# Patient Record
Sex: Female | Born: 1975 | Race: White | Hispanic: No | Marital: Married | Smoking: Never smoker
Health system: Southern US, Community
[De-identification: ages and names within clinical notes are randomized; demographics above are authoritative.]

## PROBLEM LIST (undated history)

## (undated) DIAGNOSIS — E282 Polycystic ovarian syndrome: Secondary | ICD-10-CM

## (undated) DIAGNOSIS — E039 Hypothyroidism, unspecified: Secondary | ICD-10-CM

## (undated) DIAGNOSIS — K2100 Gastro-esophageal reflux disease with esophagitis, without bleeding: Secondary | ICD-10-CM

## (undated) DIAGNOSIS — K589 Irritable bowel syndrome without diarrhea: Secondary | ICD-10-CM

## (undated) DIAGNOSIS — J45909 Unspecified asthma, uncomplicated: Secondary | ICD-10-CM

## (undated) DIAGNOSIS — J309 Allergic rhinitis, unspecified: Secondary | ICD-10-CM

## (undated) HISTORY — DX: Allergic rhinitis, unspecified: J30.9

## (undated) HISTORY — DX: Unspecified asthma, uncomplicated: J45.909

## (undated) HISTORY — DX: Hypothyroidism, unspecified: E03.9

## (undated) HISTORY — DX: Polycystic ovarian syndrome: E28.2

## (undated) HISTORY — DX: Irritable bowel syndrome, unspecified: K58.9

## (undated) HISTORY — DX: Gastro-esophageal reflux disease with esophagitis, without bleeding: K21.00

## (undated) HISTORY — DX: Gastro-esophageal reflux disease with esophagitis: K21.0

---

## 1998-12-16 HISTORY — PX: DILATION AND CURETTAGE OF UTERUS: SHX78

## 2012-12-22 DIAGNOSIS — J3489 Other specified disorders of nose and nasal sinuses: Secondary | ICD-10-CM | POA: Insufficient documentation

## 2012-12-22 DIAGNOSIS — J342 Deviated nasal septum: Secondary | ICD-10-CM | POA: Insufficient documentation

## 2013-05-24 DIAGNOSIS — J329 Chronic sinusitis, unspecified: Secondary | ICD-10-CM | POA: Insufficient documentation

## 2013-08-16 HISTORY — PX: NASAL SINUS SURGERY: SHX719

## 2013-09-01 ENCOUNTER — Ambulatory Visit: Payer: Self-pay | Admitting: Family Medicine

## 2013-09-28 LAB — HM PAP SMEAR: HM Pap smear: NEGATIVE

## 2015-06-01 ENCOUNTER — Other Ambulatory Visit: Payer: Self-pay | Admitting: Family Medicine

## 2015-06-01 DIAGNOSIS — Z3041 Encounter for surveillance of contraceptive pills: Secondary | ICD-10-CM

## 2015-06-01 DIAGNOSIS — Z309 Encounter for contraceptive management, unspecified: Secondary | ICD-10-CM | POA: Insufficient documentation

## 2015-07-07 ENCOUNTER — Other Ambulatory Visit: Payer: Self-pay | Admitting: Family Medicine

## 2015-07-07 NOTE — Telephone Encounter (Signed)
Last OV 03/2014  Thanks,   -Vernona Rieger

## 2015-08-02 DIAGNOSIS — L709 Acne, unspecified: Secondary | ICD-10-CM | POA: Insufficient documentation

## 2015-08-02 DIAGNOSIS — Z332 Encounter for elective termination of pregnancy: Secondary | ICD-10-CM | POA: Insufficient documentation

## 2015-08-02 DIAGNOSIS — J45909 Unspecified asthma, uncomplicated: Secondary | ICD-10-CM | POA: Insufficient documentation

## 2015-08-02 DIAGNOSIS — K2100 Gastro-esophageal reflux disease with esophagitis, without bleeding: Secondary | ICD-10-CM | POA: Insufficient documentation

## 2015-08-02 DIAGNOSIS — J309 Allergic rhinitis, unspecified: Secondary | ICD-10-CM | POA: Insufficient documentation

## 2015-08-02 DIAGNOSIS — E039 Hypothyroidism, unspecified: Secondary | ICD-10-CM | POA: Insufficient documentation

## 2015-08-02 DIAGNOSIS — K589 Irritable bowel syndrome without diarrhea: Secondary | ICD-10-CM | POA: Insufficient documentation

## 2015-08-02 DIAGNOSIS — L659 Nonscarring hair loss, unspecified: Secondary | ICD-10-CM | POA: Insufficient documentation

## 2015-08-02 DIAGNOSIS — E282 Polycystic ovarian syndrome: Secondary | ICD-10-CM | POA: Insufficient documentation

## 2015-08-03 ENCOUNTER — Encounter: Payer: Self-pay | Admitting: Family Medicine

## 2015-08-03 ENCOUNTER — Ambulatory Visit (INDEPENDENT_AMBULATORY_CARE_PROVIDER_SITE_OTHER): Payer: BLUE CROSS/BLUE SHIELD | Admitting: Family Medicine

## 2015-08-03 VITALS — BP 108/88 | HR 96 | Temp 98.0°F | Resp 16 | Ht 61.0 in | Wt 138.8 lb

## 2015-08-03 DIAGNOSIS — J0100 Acute maxillary sinusitis, unspecified: Secondary | ICD-10-CM

## 2015-08-03 MED ORDER — DOXYCYCLINE HYCLATE 100 MG PO TABS: 100.0000 mg | ORAL_TABLET | Freq: Two times a day (BID) | ORAL | Status: DC

## 2015-08-03 MED ORDER — HYDROCODONE-HOMATROPINE 5-1.5 MG/5ML PO SYRP: ORAL_SOLUTION | ORAL | Status: DC

## 2015-08-03 NOTE — Patient Instructions (Signed)
Continue Mucinex D. May wait for 2-3 days to see if sinuses clear prior to starting antibiotics if you wish.

## 2015-08-03 NOTE — Progress Notes (Signed)
Subjective:     Patient ID: Carolyn Haynes, female   DOB: 1976/10/13, 39 y.o.   MRN: [id number]  HPI  Chief Complaint  Patient presents with  . Cough    Patient comes in office today with concerns of cough, sore throat and right ear pain since 8/13. Patient states that she was seen at urgent care on 8/13 and diagnosed with virus and was advised to take Sudafed. Patient states that symptoms have not improved. OTC medication patient has continued is: Mucinex, Ibuprofen and Singulair  Reports increased sinus pressure and purulent drainage. Reports hx of sinus surgery. States daughter has been sick as well..   Review of Systems  Constitutional: Negative for fever and chills.  Endocrine:       States she has had updated thyroid test through work. Will try and obtain result for our review       Objective:   Physical Exam  Constitutional: She appears well-developed and well-nourished. No distress.  Ears: T.M's intact without inflammation Sinuses: Mild tenderness right maxillary area. Throat: no tonsillar enlargement or exudate Neck: no cervical adenopathy Lungs: clear     Assessment:    1. Acute maxillary sinusitis, recurrence not specified - doxycycline (VIBRA-TABS) 100 MG tablet; Take 1 tablet (100 mg total) by mouth 2 (two) times daily.  Dispense: 20 tablet; Refill: 0 - HYDROcodone-homatropine (HYCODAN) 5-1.5 MG/5ML syrup; 5 ml 4-6 hours as needed for cough  Dispense: 240 mL; Refill: 0    Plan:   Continue Mucinex D.  May give herself another 2-3 days prior to starting antibiotics.

## 2015-08-07 ENCOUNTER — Other Ambulatory Visit: Payer: Self-pay | Admitting: Family Medicine

## 2015-08-07 DIAGNOSIS — Z3041 Encounter for surveillance of contraceptive pills: Secondary | ICD-10-CM

## 2015-08-08 ENCOUNTER — Other Ambulatory Visit: Payer: Self-pay | Admitting: Family Medicine

## 2015-08-08 DIAGNOSIS — E039 Hypothyroidism, unspecified: Secondary | ICD-10-CM

## 2015-09-10 ENCOUNTER — Other Ambulatory Visit: Payer: Self-pay | Admitting: Family Medicine

## 2015-09-10 DIAGNOSIS — E039 Hypothyroidism, unspecified: Secondary | ICD-10-CM

## 2015-09-10 DIAGNOSIS — J45909 Unspecified asthma, uncomplicated: Secondary | ICD-10-CM

## 2016-01-25 ENCOUNTER — Other Ambulatory Visit: Payer: Self-pay | Admitting: Family Medicine

## 2016-01-25 DIAGNOSIS — Z3041 Encounter for surveillance of contraceptive pills: Secondary | ICD-10-CM

## 2016-03-04 ENCOUNTER — Other Ambulatory Visit: Payer: Self-pay | Admitting: Family Medicine

## 2016-03-04 DIAGNOSIS — E039 Hypothyroidism, unspecified: Secondary | ICD-10-CM

## 2016-05-31 ENCOUNTER — Other Ambulatory Visit: Payer: Self-pay | Admitting: Family Medicine

## 2016-07-01 ENCOUNTER — Ambulatory Visit (INDEPENDENT_AMBULATORY_CARE_PROVIDER_SITE_OTHER): Payer: BLUE CROSS/BLUE SHIELD | Admitting: Family Medicine

## 2016-07-01 ENCOUNTER — Encounter: Payer: Self-pay | Admitting: Family Medicine

## 2016-07-01 VITALS — BP 110/60 | HR 64 | Temp 98.2°F | Resp 16 | Ht 61.0 in | Wt 142.0 lb

## 2016-07-01 DIAGNOSIS — E039 Hypothyroidism, unspecified: Secondary | ICD-10-CM

## 2016-07-01 NOTE — Patient Instructions (Signed)
We will call you with the lab results. 

## 2016-07-01 NOTE — Progress Notes (Signed)
Subjective:     Patient ID: Carolyn Haynes, female   DOB: 1976-10-26, 40 y.o.   MRN: [id number]  HPI  Chief Complaint  Patient presents with  . Hypothyroidism    pt reports feeling tired all day long  States she feels like she did before being put on thyroid medication. Reports compliance with medication. Labs were ordered in March but not completed.   Review of Systems     Objective:   Physical Exam  Constitutional: She appears well-developed and well-nourished. No distress.       Assessment:    1. Hypothyroidism, unspecified hypothyroidism type     Free T4 and TSH    Plan:    Further f/u pending lab work.

## 2016-07-08 ENCOUNTER — Other Ambulatory Visit: Payer: Self-pay | Admitting: Family Medicine

## 2016-07-08 DIAGNOSIS — Z3041 Encounter for surveillance of contraceptive pills: Secondary | ICD-10-CM

## 2016-07-08 MED ORDER — DROSPIRENONE-ETHINYL ESTRADIOL 3-0.02 MG PO TABS: ORAL_TABLET | ORAL | 5 refills | Status: DC

## 2016-07-09 ENCOUNTER — Other Ambulatory Visit: Payer: Self-pay | Admitting: Family Medicine

## 2016-07-11 ENCOUNTER — Other Ambulatory Visit: Payer: Self-pay | Admitting: Family Medicine

## 2016-07-12 LAB — T4, FREE: Free T4: 1.39 ng/dL (ref 0.82–1.77)

## 2016-07-12 LAB — TSH: TSH: 2.75 u[IU]/mL (ref 0.450–4.500)

## 2016-07-15 ENCOUNTER — Telehealth: Payer: Self-pay

## 2016-07-15 NOTE — Telephone Encounter (Signed)
-----   Message from Anola Gurney, [place] sent at 07/15/2016  7:38 AM EDT ----- Thyroid ok; continue current dose of medication

## 2016-07-15 NOTE — Telephone Encounter (Signed)
Patient has been advised. KW 

## 2016-07-17 ENCOUNTER — Other Ambulatory Visit: Payer: Self-pay | Admitting: Family Medicine

## 2016-09-07 ENCOUNTER — Other Ambulatory Visit: Payer: Self-pay | Admitting: Family Medicine

## 2016-12-04 ENCOUNTER — Other Ambulatory Visit: Payer: Self-pay | Admitting: Family Medicine

## 2016-12-04 DIAGNOSIS — Z3041 Encounter for surveillance of contraceptive pills: Secondary | ICD-10-CM

## 2016-12-04 MED ORDER — DROSPIRENONE-ETHINYL ESTRADIOL 3-0.02 MG PO TABS: ORAL_TABLET | ORAL | 3 refills | Status: DC

## 2017-01-03 ENCOUNTER — Other Ambulatory Visit: Payer: Self-pay | Admitting: Family Medicine

## 2017-01-03 DIAGNOSIS — Z3041 Encounter for surveillance of contraceptive pills: Secondary | ICD-10-CM

## 2017-01-03 MED ORDER — DROSPIRENONE-ETHINYL ESTRADIOL 3-0.02 MG PO TABS: ORAL_TABLET | ORAL | 3 refills | Status: DC

## 2017-02-27 ENCOUNTER — Ambulatory Visit (INDEPENDENT_AMBULATORY_CARE_PROVIDER_SITE_OTHER): Payer: BLUE CROSS/BLUE SHIELD | Admitting: Family Medicine

## 2017-02-27 ENCOUNTER — Encounter: Payer: Self-pay | Admitting: Family Medicine

## 2017-02-27 VITALS — BP 124/90 | HR 96 | Temp 98.2°F | Resp 16 | Wt 134.4 lb

## 2017-02-27 DIAGNOSIS — G500 Trigeminal neuralgia: Secondary | ICD-10-CM | POA: Diagnosis not present

## 2017-02-27 MED ORDER — CARBAMAZEPINE 200 MG PO TABS: 200.0000 mg | ORAL_TABLET | Freq: Two times a day (BID) | ORAL | 0 refills | Status: DC

## 2017-02-27 NOTE — Patient Instructions (Signed)
Trigeminal Neuralgia Trigeminal neuralgia is a nerve disorder that causes attacks of severe facial pain. The attacks last from a few seconds to several minutes. They can happen for days, weeks, or months and then go away for months or years. Trigeminal neuralgia is also called tic douloureux. What are the causes? This condition is caused by damage to a nerve in the face that is called the trigeminal nerve. An attack can be triggered by:  Talking.  Chewing.  Putting on makeup.  Washing your face.  Shaving your face.  Brushing your teeth.  Touching your face. What increases the risk? This condition is more likely to develop in:  Women.  People who are 50 years of age or older. What are the signs or symptoms? The main symptom of this condition is pain in the jaw, lips, eyes, nose, scalp, forehead, and face. The pain may be intense, stabbing, electric, or shock-like. How is this diagnosed? This condition is diagnosed with a physical exam. A CT scan or MRI may be done to rule out other conditions that can cause facial pain. How is this treated? This condition may be treated with:  Avoiding the things that trigger your attacks.  Pain medicine.  Surgery. This may be done in severe cases if other medical treatment does not provide relief. Follow these instructions at home:  Take over-the-counter and prescription medicines only as told by your health care provider.  If you wish to get pregnant, talk with your health care provider before you start trying to get pregnant.  Avoid the things that trigger your attacks. It may help to:  Chew on the unaffected side of your mouth.  Avoid touching your face.  Avoid blasts of hot or cold air. Contact a health care provider if:  Your pain medicine is not helping.  You develop new, unexplained symptoms, such as:  Double vision.  Facial weakness.  Changes in hearing or balance.  You become pregnant. Get help right away  if:  Your pain is unbearable, and your pain medicine does not help. This information is not intended to replace advice given to you by your health care provider. Make sure you discuss any questions you have with your health care provider. Document Released: 11/29/2000 Document Revised: 08/04/2016 Document Reviewed: 03/27/2015 Elsevier Interactive Patient Education  2017 Elsevier Inc.  

## 2017-02-27 NOTE — Progress Notes (Addendum)
Subjective:     Patient ID: Carolyn Haynes, female   DOB: 08/15/1976, 41 y.o.   MRN: [id number]  HPI  Chief Complaint  Patient presents with  . Facial Pain    Patient comes in office today with complaints of sinus pain and pressure on the right side of her face since 01/21/17. Patient states that she has been on 3 rounds of steroids and antibiotics and nothing has cleared symptoms. Patient reports that she is currently using Afrin and Ibuprofen for relief.   States she last completed a course of Levaquin and prednisone around 3/10. Reports transient improvement with prednisone. Reports the whole right side of her face/neck is painful: "shooting pain." Can be exacerbated by touch and chewing. Had a dental exam recently with no adverse findings. Denis sinus drainage at this time. Has had prior nasal/left sided sinus surgery @ [facility] after a fracture.   Review of Systems     Objective:   Physical Exam  Constitutional: She appears well-developed and well-nourished. No distress.  HENT:  Right Ear: Tympanic membrane normal.  Left Ear: Tympanic membrane normal.  Mouth/Throat: Oropharynx is clear and moist.  Musculoskeletal:  No facial droop with preserved symmetry.  Skin:  Sensitive to touch along her right sided neck, face stopping at midline.       Assessment:    1. 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  Dispense: 28 tablet; Refill: 0    Plan:    Further f/u pending neurology evaluation.

## 2017-03-03 ENCOUNTER — Other Ambulatory Visit: Payer: Self-pay | Admitting: Family Medicine

## 2017-03-03 ENCOUNTER — Telehealth: Payer: Self-pay | Admitting: Family Medicine

## 2017-03-03 DIAGNOSIS — G500 Trigeminal neuralgia: Secondary | ICD-10-CM

## 2017-03-03 MED ORDER — CARBAMAZEPINE 200 MG PO TABS: ORAL_TABLET | ORAL | 0 refills | Status: DC

## 2017-03-03 NOTE — Telephone Encounter (Signed)
Pt was prescribed medication for facial nerve pain.  She said it is helping but not totally getting rid of the pain.  She wants to know if it can be increased.  [facility]  Thank sTeri

## 2017-03-03 NOTE — Telephone Encounter (Signed)
Patient has been advised. KW 

## 2017-03-03 NOTE — Telephone Encounter (Signed)
Increase carbamazepine to two pills twice daily. I have sent in additional medication.

## 2017-03-03 NOTE — Telephone Encounter (Signed)
Patient seen in office 02/28/07, please review note and advise. KW

## 2017-03-07 ENCOUNTER — Telehealth: Payer: Self-pay

## 2017-03-07 ENCOUNTER — Other Ambulatory Visit: Payer: Self-pay | Admitting: Family Medicine

## 2017-03-07 DIAGNOSIS — G500 Trigeminal neuralgia: Secondary | ICD-10-CM

## 2017-03-07 MED ORDER — GABAPENTIN 300 MG PO CAPS: ORAL_CAPSULE | ORAL | 0 refills | Status: DC

## 2017-03-07 NOTE — Telephone Encounter (Signed)
Patient called stating she had been seen by Bob Last weeNadine Countsk.  Chart diagnosis is trigeminal neuralgia.  She was given Tegretol 200 mg titrating from 1 BID to 2 BID.  5 days age she started on the 2 BID.  Since that time she has noticed that the pain is improving however she is having numbness of the tongue and feelings of dizziness, foggy and had near syncopal episode.  While on 1 BID she did not have any improvement in the pain.  After discussing with provider, the patient has been instructed to decrease to 1 BID and he will look into an alternate treatment.  Patient verbalizes understanding of the instructions and has been told we will call her with any further instructions.    Patient call back number is [phone] ED

## 2017-03-07 NOTE — Telephone Encounter (Signed)
Patient was calling back office stating that nurse she spoke with earlier was going to discuss her issue with Tegretol with PCP. Patient wanted to know if Nadine CountsBob would be switching her medication to something else? Please review and advise. KW

## 2017-03-07 NOTE — Telephone Encounter (Signed)
Discussed stopping carbamazapine and trying gabapentin which was sent to her pharmacy. Neurology appointment is not until April. I have asked her to call and see if there are any cancellations.

## 2017-03-12 DIAGNOSIS — G500 Trigeminal neuralgia: Secondary | ICD-10-CM | POA: Insufficient documentation

## 2017-03-18 ENCOUNTER — Encounter: Payer: Self-pay | Admitting: Family Medicine

## 2017-03-24 ENCOUNTER — Other Ambulatory Visit: Payer: Self-pay | Admitting: Neurology

## 2017-03-24 DIAGNOSIS — G500 Trigeminal neuralgia: Secondary | ICD-10-CM

## 2017-04-02 ENCOUNTER — Ambulatory Visit
Admission: RE | Admit: 2017-04-02 | Discharge: 2017-04-02 | Disposition: A | Discharge status: Home / Self Care | Payer: BLUE CROSS/BLUE SHIELD | Source: Ambulatory Visit | Attending: Neurology | Admitting: Neurology

## 2017-04-02 DIAGNOSIS — G500 Trigeminal neuralgia: Secondary | ICD-10-CM | POA: Diagnosis present

## 2017-04-02 DIAGNOSIS — R600 Localized edema: Secondary | ICD-10-CM | POA: Insufficient documentation

## 2017-04-02 DIAGNOSIS — Z9889 Other specified postprocedural states: Secondary | ICD-10-CM | POA: Diagnosis not present

## 2017-04-02 MED ORDER — GADOBENATE DIMEGLUMINE 529 MG/ML IV SOLN
15.0000 mL | Freq: Once | INTRAVENOUS | Status: AC | PRN
  Administered 2017-04-02: 12 mL via INTRAVENOUS

## 2017-06-06 ENCOUNTER — Encounter: Payer: Self-pay | Admitting: Family Medicine

## 2017-06-06 ENCOUNTER — Ambulatory Visit (INDEPENDENT_AMBULATORY_CARE_PROVIDER_SITE_OTHER): Payer: BLUE CROSS/BLUE SHIELD | Admitting: Family Medicine

## 2017-06-06 ENCOUNTER — Other Ambulatory Visit: Payer: Self-pay | Admitting: Family Medicine

## 2017-06-06 VITALS — BP 100/64 | HR 86 | Temp 97.9°F | Resp 16 | Wt 143.2 lb

## 2017-06-06 DIAGNOSIS — J019 Acute sinusitis, unspecified: Secondary | ICD-10-CM

## 2017-06-06 MED ORDER — ALBUTEROL SULFATE HFA 108 (90 BASE) MCG/ACT IN AERS: 2.0000 | INHALATION_SPRAY | Freq: Four times a day (QID) | RESPIRATORY_TRACT | 2 refills | Status: DC | PRN

## 2017-06-06 MED ORDER — DOXYCYCLINE HYCLATE 100 MG PO TABS: 100.0000 mg | ORAL_TABLET | Freq: Two times a day (BID) | ORAL | 0 refills | Status: DC

## 2017-06-06 NOTE — Progress Notes (Signed)
Subjective:     Patient ID: Carolyn Haynes, female   DOB: Aug 11, 1976, 41 y.o.   MRN: [id number]  HPI  Chief Complaint  Patient presents with  . Cough    Patient comes in office today with complaints of cough and sinus pain and pressure for the past 7 days. Patient reports that cough is productive of yellow phlegm and has had wheezing through out her day. Patient states that she has been taking otc Mucinex for relief.   Patient reports increased sinus pressure, purulent sinus drainage, post nasal drainage and accompanying cough. States everyone in her office has been sick. Hx of sinus surgery and reactive airways when she is ill. Currently being treated for trigeminal neuralgia. Considering Gamma knife procedure.   Review of Systems     Objective:   Physical Exam  Constitutional: She appears well-developed and well-nourished. No distress.  Ears: T.M's intact without inflammation Throat: no tonsillar enlargement or exudate Neck: no cervical adenopathy Lungs: clear     Assessment:    1. Acute sinusitis, recurrence not specified, unspecified location - doxycycline (VIBRA-TABS) 100 MG tablet; Take 1 tablet (100 mg total) by mouth 2 (two) times daily.  Dispense: 20 tablet; Refill: 0 - albuterol (PROVENTIL HFA;VENTOLIN HFA) 108 (90 Base) MCG/ACT inhaler; Inhale 2 puffs into the lungs every 6 (six) hours as needed for wheezing or shortness of breath.  Dispense: 1 Inhaler; Refill: 2    Plan:    Continue otc cold preparation and may add Delsym for cough.

## 2017-06-06 NOTE — Patient Instructions (Signed)
Continue Mucinex for cold symptoms. May use Delsym for cough.

## 2017-06-16 ENCOUNTER — Other Ambulatory Visit: Payer: Self-pay | Admitting: Family Medicine

## 2017-06-16 DIAGNOSIS — G500 Trigeminal neuralgia: Secondary | ICD-10-CM

## 2017-09-16 ENCOUNTER — Other Ambulatory Visit: Payer: Self-pay | Admitting: Family Medicine

## 2017-09-16 NOTE — Telephone Encounter (Signed)
Bob's patient. Last OV 06/06/17.  Last RF 09/09/16.  Please review.

## 2017-09-17 NOTE — Telephone Encounter (Signed)
Left patient a message advising her to call back to schedule a follow up appointment.

## 2017-09-17 NOTE — Telephone Encounter (Signed)
F/u with Nadine Counts, likely needs OV.

## 2017-10-06 DIAGNOSIS — R768 Other specified abnormal immunological findings in serum: Secondary | ICD-10-CM | POA: Insufficient documentation

## 2017-10-13 HISTORY — PX: BRAIN SURGERY: SHX531

## 2017-10-24 ENCOUNTER — Other Ambulatory Visit: Payer: Self-pay | Admitting: Physician Assistant

## 2017-11-03 ENCOUNTER — Other Ambulatory Visit: Payer: Self-pay | Admitting: Family Medicine

## 2017-11-04 ENCOUNTER — Other Ambulatory Visit: Payer: Self-pay | Admitting: Family Medicine

## 2017-11-04 DIAGNOSIS — E039 Hypothyroidism, unspecified: Secondary | ICD-10-CM

## 2017-12-22 ENCOUNTER — Other Ambulatory Visit: Payer: Self-pay | Admitting: Family Medicine

## 2017-12-22 DIAGNOSIS — E039 Hypothyroidism, unspecified: Secondary | ICD-10-CM

## 2017-12-22 NOTE — Telephone Encounter (Signed)
Last filled 11/14/17 there are still future orders pending in chart. KW

## 2017-12-22 NOTE — Telephone Encounter (Signed)
Patient needs to update labs. Labs will need to be reprinted for [facility]

## 2017-12-22 NOTE — Telephone Encounter (Signed)
[facility] supply for the following medication. Thanks CC ° °levothyroxine (SYNTHROID, LEVOTHROID) 50 MCG tablet  ° °

## 2017-12-23 NOTE — Telephone Encounter (Signed)
Patient advised that labs need to be drawn first, placed order in chart to have labs drawn at [facility]. KW

## 2017-12-31 ENCOUNTER — Other Ambulatory Visit: Payer: Self-pay | Admitting: Family Medicine

## 2017-12-31 NOTE — Telephone Encounter (Signed)
Please have her get her thyroid labs drawn, they have been ordered. May refill her medication for 30 days.

## 2017-12-31 NOTE — Telephone Encounter (Signed)
Left message for patient to call back office. KW

## 2018-01-05 ENCOUNTER — Telehealth: Payer: Self-pay | Admitting: Family Medicine

## 2018-01-05 NOTE — Telephone Encounter (Signed)
[id number] 

## 2018-01-05 NOTE — Telephone Encounter (Signed)
I just filled it for 30 until we got the labs.

## 2018-01-05 NOTE — Telephone Encounter (Signed)
You just filled Rx on 12/31/17 for 30day supply, please make adjustment if needed. Im still trying to get a hold of patient so she knows to have labs drawn. KW

## 2018-01-05 NOTE — Telephone Encounter (Signed)
[facility] supply for the following medication. Thanks CC ° °levothyroxine (SYNTHROID, LEVOTHROID) 50 MCG tablet  ° °

## 2018-01-07 ENCOUNTER — Telehealth: Payer: Self-pay | Admitting: Family Medicine

## 2018-01-07 NOTE — Telephone Encounter (Signed)
[id number] 

## 2018-01-07 NOTE — Telephone Encounter (Signed)
[facility]. Thanks CC  levothyroxine (SYNTHROID, LEVOTHROID) 50 MCG tablet

## 2018-01-08 NOTE — Telephone Encounter (Signed)
Please see previous telephone encounters in chart. Still trying to get a hold of patient for prescription. KW

## 2018-01-15 ENCOUNTER — Other Ambulatory Visit: Payer: Self-pay | Admitting: Family Medicine

## 2018-01-15 NOTE — Telephone Encounter (Signed)
[facility] a [facility]-days supply for the following medication. Thanks CC  levothyroxine (SYNTHROID, LEVOTHROID) 50 MCG tablet

## 2018-01-15 NOTE — Telephone Encounter (Signed)
Spoke with patient on the phone and advised her that a 30day script was sent on 12/31/17 but wont be filled any further until labs are drawn. Patient states that she plans to come out to [facility] next week to have labs drawn, she states that we can disregard fax refill request. Renette ButtersKW

## 2018-01-17 ENCOUNTER — Other Ambulatory Visit: Payer: Self-pay | Admitting: Family Medicine

## 2018-01-23 ENCOUNTER — Other Ambulatory Visit: Payer: Self-pay | Admitting: Family Medicine

## 2018-01-23 NOTE — Telephone Encounter (Signed)
Patient stated that she is going to come by today to have labs drawn today. Patient stated she has 2 pills left and is worried about not getting results back before she is out of the med. Patient requesting rx for a small amount of the levothyroxine be sent to pharmacy until results are in. Please advise?

## 2018-01-23 NOTE — Telephone Encounter (Signed)
Pharmacist stats that prescription has been ready since 01/19/18, called back patient and notified her to pick up prescription. KW

## 2018-01-23 NOTE — Telephone Encounter (Signed)
Levothroxine 50 MCG  [facility] [address] [facility]  Pt states pharmacy says they do not have any RX on file for her.

## 2018-01-23 NOTE — Telephone Encounter (Signed)
[facility] Please check.

## 2018-01-23 NOTE — Telephone Encounter (Signed)
Patient states that pharmacy did have prescription in there record, will contact pharmacy to clarify. KW

## 2018-01-24 LAB — TSH+FREE T4
Free T4: 1.42 ng/dL (ref 0.82–1.77)
TSH: 2.88 u[IU]/mL (ref 0.450–4.500)

## 2018-01-26 ENCOUNTER — Telehealth: Payer: Self-pay

## 2018-01-26 ENCOUNTER — Other Ambulatory Visit: Payer: Self-pay | Admitting: Family Medicine

## 2018-01-26 MED ORDER — LEVOTHYROXINE SODIUM 50 MCG PO TABS: 50.0000 ug | ORAL_TABLET | Freq: Every day | ORAL | 3 refills | Status: DC

## 2018-01-26 NOTE — Telephone Encounter (Signed)
Pt advised.   Thanks,   -Laura  

## 2018-01-26 NOTE — Telephone Encounter (Signed)
-----   Message from Anola Gurneyobert Chauvin, [place] sent at 01/26/2018  7:25 AM EST ----- Thyroid tests good. Will refill medications

## 2018-02-05 ENCOUNTER — Other Ambulatory Visit: Payer: Self-pay | Admitting: Family Medicine

## 2018-02-05 DIAGNOSIS — Z3041 Encounter for surveillance of contraceptive pills: Secondary | ICD-10-CM

## 2018-05-25 ENCOUNTER — Ambulatory Visit (INDEPENDENT_AMBULATORY_CARE_PROVIDER_SITE_OTHER): Payer: Managed Care, Other (non HMO) | Admitting: Family Medicine

## 2018-05-25 ENCOUNTER — Other Ambulatory Visit: Payer: Self-pay | Admitting: Family Medicine

## 2018-05-25 ENCOUNTER — Encounter: Payer: Self-pay | Admitting: Family Medicine

## 2018-05-25 VITALS — BP 120/88 | HR 90 | Temp 98.3°F | Resp 15 | Ht 60.5 in | Wt 140.6 lb

## 2018-05-25 DIAGNOSIS — J301 Allergic rhinitis due to pollen: Secondary | ICD-10-CM | POA: Diagnosis not present

## 2018-05-25 DIAGNOSIS — Z0000 Encounter for general adult medical examination without abnormal findings: Secondary | ICD-10-CM

## 2018-05-25 NOTE — Progress Notes (Addendum)
  Subjective:     Patient ID: Carolyn Haynes, female   DOB: Jun 27, 1976, 42 y.o.   MRN: [id number] Chief Complaint  Patient presents with  . Annual Exam    Patient comes in office today for her annual physical, patient states that she feels well today and has no issues to address. Patient reports following a well balanced diet, exercisign 3x a week at gym and sleeping on average 7-8hrs a night. Patient is due today for pap exam, mammogram, screnning labs and Tdap.    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 Has had labs at work and will bring them for our review. Thyroid labs earlier this year.  Review of Systems General: Feeling well. States she has unusual reaction to certain food smells (esp. Fried foods) with sagging of the right side of her face. HEENT: overdue for dental exam and encouraged to do so. Reports vision corrective eye surgery a year ago. Reports seasonal allergies. Cardiovascular: no chest pain, shortness of breath, or palpitations GI: no heartburn, no change in bowel habits or blood in the stool GU: no change in bladder habits. Hx of PCOS with LMP 3-4 months ago Breast: no family hx of breast cancer Neuro: Hx of nerve decompression surgery for trigeminal neuralgia. Psychiatric: not depressed Musculoskeletal: no joint pain    Objective:   Physical Exam  Constitutional: She appears well-developed and well-nourished. No distress.  Eyes: PERRLA Neck: no thyromegaly, tenderness or nodules, no cervical adenopathy ENT: TM's intact without inflammation; No tonsillar enlargement or exudate, Lungs: Clear Breasts: no axillary adenopathy,no breast mass or nipple discharge Heart : RRR without murmur or gallop Abd: bowel sounds present, soft, non-tender, no organomegaly GU: no external lesion, no vaginal discharge, cervical os is closed and friable with ? mass-Pap attempted. No adnexal mass or tenderness. Extremities: no  edema Skin: no atypical lesions noted.     Assessment:    1. Annual physical exam - Pap IG and HPV (high risk) DNA detection  2. Seasonal allergic rhinitis due to pollen - Ambulatory referral to Allergy    Plan:    Will return for tetanus update and bring labs for review in the near future. Discussed otc allergy medication. 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

## 2018-05-25 NOTE — Patient Instructions (Addendum)
We will call you about the referral and pap smear. Do return for a tetanus shot and bring your labs for my review. Try Claritin or Allegra then add a steroid nasal spray like Flonase.

## 2018-05-27 LAB — PAP IG AND HPV HIGH-RISK
HPV, high-risk: POSITIVE — AB
PAP Smear Comment: 0

## 2018-05-28 ENCOUNTER — Other Ambulatory Visit: Payer: Self-pay | Admitting: Family Medicine

## 2018-05-28 DIAGNOSIS — R87619 Unspecified abnormal cytological findings in specimens from cervix uteri: Secondary | ICD-10-CM

## 2018-06-03 ENCOUNTER — Telehealth: Payer: Self-pay | Admitting: Family Medicine

## 2018-06-03 NOTE — Telephone Encounter (Signed)
Pt was referred to OBGYN and after speaking with pt the referral was sent to [facility] b/c pt wishes to see a female MD. Pt called this morning stating that Dr. Valentino Saxonherry at [facility] isn't taking new pt. Pt is [place] be [place]. Please advise. Thanks TNP

## 2018-06-08 ENCOUNTER — Encounter: Payer: Self-pay | Admitting: Allergy and Immunology

## 2018-06-16 ENCOUNTER — Telehealth: Payer: Self-pay | Admitting: Obstetrics & Gynecology

## 2018-06-16 NOTE — Telephone Encounter (Signed)
[facility] referring for Abnormal cervical Papanicolaou smear, unspecified abnormal pap finding. Called and left voicemail for patient to call back to be schedule

## 2018-06-17 NOTE — Telephone Encounter (Signed)
Patient is schedule 07/02/18 with Dr. Jerene PitchSchuman

## 2018-06-26 ENCOUNTER — Encounter: Payer: Self-pay | Admitting: Allergy

## 2018-06-26 ENCOUNTER — Ambulatory Visit: Payer: Managed Care, Other (non HMO) | Admitting: Allergy

## 2018-06-26 VITALS — BP 110/74 | HR 82 | Temp 97.8°F | Resp 20 | Ht 60.2 in | Wt 137.4 lb

## 2018-06-26 DIAGNOSIS — T781XXA Other adverse food reactions, not elsewhere classified, initial encounter: Secondary | ICD-10-CM

## 2018-06-26 NOTE — Patient Instructions (Signed)
Adverse food reaction    - you are having reactions to certain foods in certain environments.  Testing today to yeast is negative.  It is most likely that you have an intolerance which does not lead to life threatening symptoms but can cause symptoms such as GI upset/discomfort as well as itch through a non-IgE mediated pathway (this is the food allergy pathway).  Avoidance of the foods/places that cause symptoms is still the recommend therapy.      - let us know if you ever develop difficulty breathing/cough/wheeze, vomiting, lightheaded/dizziness/fainting with any food ingestion as this could suggest an IgE-mediated reaction.     - at this time you do not warrant need to have an epinephrine device.     - if GI symptoms continue with food ingestion would recommend a referral to gastroenterology.     Follow-up as needed

## 2018-06-26 NOTE — Progress Notes (Signed)
New Patient Note  RE: Carolyn Haynes MRN: [id number] DOB: Jan 11, 1976 Date of Office Visit: 06/26/2018  Referring provider: Anola Gurney, PA Primary care provider: Anola Gurney, PA  Chief Complaint: Food allergy  History of present illness: Carolyn Haynes is a 42 y.o. female presenting today for consultation for food allergy evaluation.  She states she has had food allergy for 6 years now and she would like to know what she is allergic to.  She states if she walks into [place] the smell of the bread being made she states causes  her face gets droopy unilaterally and also has facial itch without rash.  She states there is also happens when she eats breading like in crabcakes. She will take benadryl if the symptoms occur which she states does help.    She states she can eat bread on sandwich, pasta, bread sticks, cheese sticks with breading without issue.  She also states that she prepares her own crab cakes that it does not happen. Her other concern is that if she eats fried food out at restaurants or fast food chains she develops abd pain and diarrhea.  These GI symptoms can persist for several days before resolving.  Thus she tries to avoid eating out and getting fried food.  She states if she cooks her own fried food at home she does not have the GI symptoms.  No signicant history for seasonal or perennial allergies.  She states she may have occasional runny nose or stuffy nose but does not take any medications for this.  She does have albuterol inhaler due to exercise induced cough but has not needed to use in over a year.  She states she is not identify any other triggers besides exercise.  She has never required a daily controller medication.  She denies any need for oral steroids.  Denies nighttime awakenings.  Has never required any ED or urgent care visits or hospitalizations.  No history of eczema.     Review of systems: Review of Systems  Constitutional: Negative for chills,  fever and malaise/fatigue.  HENT: Negative for congestion, ear discharge, ear pain, nosebleeds, sinus pain and sore throat.   Eyes: Negative for pain, discharge and redness.  Respiratory: Negative for cough, shortness of breath and wheezing.   Cardiovascular: Negative for chest pain.  Gastrointestinal: Negative for abdominal pain, constipation, diarrhea, heartburn, nausea and vomiting.  Musculoskeletal: Negative for joint pain.  Skin: Negative for itching and rash.  Neurological: Negative for headaches.    All other systems negative unless noted above in HPI  Past medical history: Past Medical History:  Diagnosis Date  . Allergic rhinitis   . Asthma   . Esophagitis, reflux   . Hypothyroid   . IBS (irritable bowel syndrome)   . Polycystic ovaries     Past surgical history: Past Surgical History:  Procedure Laterality Date  . DILATION AND CURETTAGE OF UTERUS  2000  . NASAL SINUS SURGERY  08/2013    Family history:  Family History  Problem Relation Age of Onset  . Hypertension Father   . Cancer Maternal Grandmother        pancreatic  . Allergic rhinitis Neg Hx   . Asthma Neg Hx   . Eczema Neg Hx   . Urticaria Neg Hx     Social history: She lives in a home with carpeting with gas heating and central cooling.  2 dogs in the home.  No concern for water damage, mildew or roaches in the  home.  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  She denies a smoking history.  Medication List: Allergies as of 06/26/2018      Reactions   Carbamazepine    Other reaction(s): Other (See Comments) Made patient sleepy loopy   Penicillins       Medication List        Accurate as of 06/26/18  1:51 PM. Always use your most recent med list.          cholecalciferol 1000 units tablet Commonly known as:  VITAMIN D Take 1,000 Units by mouth daily.   levothyroxine 50 MCG tablet Commonly known as:  SYNTHROID, LEVOTHROID Take 1 tablet (50 mcg total) by mouth daily.     magnesium (amino acid chelate) 133 MG tablet Take 1 tablet by mouth 2 (two) times daily.   NIKKI 3-0.02 MG tablet Generic drug:  drospirenone-ethinyl estradiol TAK 1 TABLET BY MOUTH DAILY   vitamin B-12 1000 MCG tablet Commonly known as:  CYANOCOBALAMIN Take by mouth.       Known medication allergies: Allergies  Allergen Reactions  . Carbamazepine     Other reaction(s): Other (See Comments) Made patient sleepy loopy  . Penicillins      Physical examination: Blood pressure 110/74, pulse 82, temperature 97.8 F (36.6 C), temperature source Oral, resp. rate 20, height 5' 0.2" (1.529 m), weight 137 lb 6.4 oz (62.3 kg), SpO2 97 %.  General: Alert, interactive, in no acute distress. HEENT: PERRLA, TMs pearly gray, turbinates non-edematous without discharge, post-pharynx non erythematous. Neck: Supple without lymphadenopathy. Lungs: Clear to auscultation without wheezing, rhonchi or rales. {no increased work of breathing. CV: Normal S1, S2 without murmurs. Abdomen: Nondistended, nontender. Skin: Warm and dry, without lesions or rashes. Extremities:  No clubbing, cyanosis or edema. Neuro:   Grossly intact.  Diagnositics/Labs:  Allergy testing: Patient reviewed our food allergy skin testing sheet and determined testing to only saccharomycs cerevisiae was necessary and I agreed with this due to reaction to baking bread at [employer].  Skin prick to saccharomycs cerevisiae was negative. Allergy testing results were read and interpreted by provider, documented by clinical staff.   Assessment and plan:   Adverse food reaction    - you are having reactions to certain foods in certain environments.  Testing today to yeast is negative.  It is most likely that you have an intolerance which does not lead to life threatening symptoms but can cause symptoms such as GI upset/discomfort as well as itch through a non-IgE mediated pathway (this is the food allergy pathway).  Avoidance of the  foods/places that cause symptoms is still the recommend therapy.      - let us know if you ever develop difficulty breathing/cough/wheeze, vomiting, lightheaded/dizziness/fainting with any food ingestion as this could suggest an IgE-mediated reaction.     - at this time you do not warrant need to have an epinephrine device.     - if GI symptoms continue with food ingestion would recommend a referral to gastroenterology.     Follow-up as needed  I appreciate the opportunity to take part in Carolyn Haynes's care. Please do not hesitate to contact me with questions.  Sincerely,   Margo AyeShaylar Padgett, MD Allergy/Immunology [facility]

## 2018-07-02 ENCOUNTER — Ambulatory Visit: Payer: Self-pay | Admitting: Obstetrics and Gynecology

## 2018-07-10 ENCOUNTER — Ambulatory Visit (INDEPENDENT_AMBULATORY_CARE_PROVIDER_SITE_OTHER): Payer: Managed Care, Other (non HMO) | Admitting: Obstetrics and Gynecology

## 2018-07-10 ENCOUNTER — Other Ambulatory Visit (HOSPITAL_COMMUNITY)
Admission: RE | Admit: 2018-07-10 | Discharge: 2018-07-10 | Disposition: A | Discharge status: Home / Self Care | Payer: Managed Care, Other (non HMO) | Source: Ambulatory Visit | Attending: Obstetrics and Gynecology | Admitting: Obstetrics and Gynecology

## 2018-07-10 ENCOUNTER — Encounter: Payer: Self-pay | Admitting: Obstetrics and Gynecology

## 2018-07-10 VITALS — BP 104/70 | HR 87 | Ht 61.5 in | Wt 136.0 lb

## 2018-07-10 DIAGNOSIS — R87618 Other abnormal cytological findings on specimens from cervix uteri: Secondary | ICD-10-CM | POA: Insufficient documentation

## 2018-07-10 DIAGNOSIS — R87810 Cervical high risk human papillomavirus (HPV) DNA test positive: Secondary | ICD-10-CM

## 2018-07-10 NOTE — Progress Notes (Signed)
   GYNECOLOGY CLINIC COLPOSCOPY PROCEDURE NOTE  42 y.o. 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 Discussed underlying role for HPV infection in the development of cervical dysplasia, its natural history and progression/regression, need for surveillance.  Is the patient  pregnant: No LMP: Patient's last menstrual period was 07/04/2018. Smoking status:  reports that she has never smoked. She has never used smokeless tobacco. Contraception: OCP (estrogen/progesterone) Future fertility desired:  No  Patient given informed consent, signed copy in the chart, time out was performed.  The patient was position in dorsal lithotomy position. Speculum was placed the cervix was visualized.   After application of acetic acid colposcopic inspection of the cervix was undertaken.   Colposcopy adequate, full visualization of transformation zone: Yes Mosaic changes between 3 and 9 o'clock, biopsies taken at 5 and 7 o'clock ECC specimen obtained:  Yes  All specimens were labeled and sent to pathology.   Patient was given post procedure instructions.  Will follow up pathology and manage accordingly.  Routine preventative health maintenance measures emphasized.  Physical Exam  Genitourinary:    Vitals reviewed.   She has been having heavier periods over the last 6-7 months. I discussed options of using and IUD such as a [name] or a novasure ablation. She will consider this and return if she desires one of these procedures.   Adelene Idlerhristanna Schuman MD [facility], [facility] 07/10/18 3:30 PM

## 2018-07-10 NOTE — Patient Instructions (Signed)
Colposcopy, Care After  This sheet gives you information about how to care for yourself after your procedure. Your doctor may also give you more specific instructions. If you have problems or questions, contact your doctor.  What can I expect after the procedure?  If you did not have a tissue sample removed (did not have a biopsy), you may only have some spotting for a few days. You can go back to your normal activities.  If you had a tissue sample removed, it is common to have:  · Soreness and pain. This may last for a few days.  · Light-headedness.  · Mild bleeding from your vagina or dark-colored, grainy discharge from your vagina. This may last for a few days. You may need to wear a sanitary pad.  · Spotting for at least 48 hours after the procedure.    Follow these instructions at home:  · Take over-the-counter and prescription medicines only as told by your doctor. Ask your doctor what medicines you can start taking again. This is very important if you take blood-thinning medicine.  · Do not drive or use heavy machinery while taking prescription pain medicine.  · For 3 days, or as long as your doctor tells you, avoid:  ? Douching.  ? Using tampons.  ? Having sex.  · If you use birth control (contraception), keep using it.  · Limit activity for the first day after the procedure. Ask your doctor what activities are safe for you.  · It is up to you to get the results of your procedure. Ask your doctor when your results will be ready.  · Keep all follow-up visits as told by your doctor. This is important.  Contact a doctor if:  · You get a skin rash.  Get help right away if:  · You are bleeding a lot from your vagina. It is a lot of bleeding if you are using more than one pad an hour for 2 hours in a row.  · You have clumps of blood (blood clots) coming from your vagina.  · You have a fever.  · You have chills  · You have pain in your lower belly (pelvic area).  · You have signs of infection, such as vaginal  discharge that is:  ? Different than usual.  ? Yellow.  ? Bad-smelling.  · You have very pain or cramps in your lower belly that do not get better with medicine.  · You feel light-headed.  · You feel dizzy.  · You pass out (faint).  Summary  · If you did not have a tissue sample removed (did not have a biopsy), you may only have some spotting for a few days. You can go back to your normal activities.  · If you had a tissue sample removed, it is common to have mild pain and spotting for 48 hours.  · For 3 days, or as long as your doctor tells you, avoid douching, using tampons and having sex.  · Get help right away if you have bleeding, very bad pain, or signs of infection.  This information is not intended to replace advice given to you by your health care provider. Make sure you discuss any questions you have with your health care provider.  Document Released: 05/20/2008 Document Revised: 08/21/2016 Document Reviewed: 08/21/2016  Elsevier Interactive Patient Education © 2018 Elsevier Inc.

## 2018-07-27 NOTE — Progress Notes (Signed)
Repeat pap smear with HPV testing in 1 year. Called patient, left message to call office.

## 2018-07-29 ENCOUNTER — Telehealth: Payer: Self-pay | Admitting: Obstetrics and Gynecology

## 2018-07-29 NOTE — Telephone Encounter (Signed)
Patient is [facility]05[facility]18 [facility]

## 2018-07-29 NOTE — Telephone Encounter (Signed)
Patient is calling for labs results. Please advise. 

## 2018-07-31 NOTE — Telephone Encounter (Signed)
Called and discussed result with patient.

## 2018-08-05 ENCOUNTER — Ambulatory Visit (INDEPENDENT_AMBULATORY_CARE_PROVIDER_SITE_OTHER): Payer: Managed Care, Other (non HMO) | Admitting: Obstetrics and Gynecology

## 2018-08-05 ENCOUNTER — Encounter: Payer: Self-pay | Admitting: Obstetrics and Gynecology

## 2018-08-05 VITALS — BP 108/70 | Ht 61.5 in | Wt 138.0 lb

## 2018-08-05 DIAGNOSIS — Z30430 Encounter for insertion of intrauterine contraceptive device: Secondary | ICD-10-CM | POA: Diagnosis not present

## 2018-08-05 MED ORDER — LEVONORGESTREL 19.5 MG IU IUD: 1.0000 | INTRAUTERINE_SYSTEM | Freq: Once | INTRAUTERINE | Status: DC

## 2018-08-05 NOTE — Progress Notes (Signed)
  IUD PROCEDURE NOTE:  Carolyn Haynes is a 42 y.o. [id number] here for IUD insertion. No GYN concerns.  Last pap smear was normal.  IUD Insertion Procedure Note Patient identified, informed consent performed, consent signed.   Discussed risks of irregular bleeding, cramping, infection, malpositioning or misplacement of the IUD outside the uterus which may require further procedure such as laparoscopy, risk of failure <1%. Time out was performed.  Urine pregnancy test negative.  A bimanual exam showed the uterus to be retroverted.  Speculum placed in the vagina.  Cervix visualized.  Cleaned with Betadine x 2.  Grasped anteriorly with a single tooth tenaculum.  Gentle dilation of the cervical canal with dilators. Uterus sounded to 9 cm.   IUD placed per manufacturer's recommendations.  Strings trimmed to 3 cm. Tenaculum was removed, good hemostasis noted. Silver nitrate applied to the right tenaculum site.  Patient tolerated procedure well.   Patient was given post-procedure instructions.  She was advised to have backup contraception for one week.  Patient was also asked to check IUD strings periodically and follow up in 4 weeks for IUD check.  Adelene Idlerhristanna Schuman MD [facility], [facility] 08/05/18 1:18 PM

## 2018-08-22 ENCOUNTER — Other Ambulatory Visit: Payer: Self-pay | Admitting: Family Medicine

## 2018-08-22 DIAGNOSIS — Z3041 Encounter for surveillance of contraceptive pills: Secondary | ICD-10-CM

## 2018-09-04 ENCOUNTER — Ambulatory Visit: Payer: Managed Care, Other (non HMO) | Admitting: Obstetrics and Gynecology

## 2018-09-07 ENCOUNTER — Ambulatory Visit: Payer: Managed Care, Other (non HMO) | Admitting: Obstetrics and Gynecology

## 2018-10-28 ENCOUNTER — Telehealth: Payer: Self-pay | Admitting: Family Medicine

## 2018-10-28 NOTE — Telephone Encounter (Signed)
Pt asking for a refill on:  tessalon perles for her allergy / coughing  Please fill at: [id number] [place], [place] - [address] [phone] (Phone) [phone] (Fax)     Thanks, [facility]

## 2018-10-29 NOTE — Telephone Encounter (Signed)
[id number] 

## 2018-11-02 NOTE — Telephone Encounter (Signed)
Agree. I don't see where I have rx'ed Tessalon for her in the past.

## 2018-11-02 NOTE — Telephone Encounter (Signed)
Left message for patient to call back and clarify why prescription is needed. LOV was 05/25/18. KW

## 2018-11-04 NOTE — Telephone Encounter (Signed)
Pt.advised.KW 

## 2018-11-04 NOTE — Telephone Encounter (Signed)
Spoke with patient on the phone she states that she has allergies that causes her to have a dry cough, patient states that this occurs in the fall, winter and spring. Patient states that we have prescribed Tessalon before in the past to help with cough and wants to know if it can be filled. KW

## 2018-11-04 NOTE — Telephone Encounter (Signed)
I would recommend an office visit to discuss treatment of allergies. If we control her allergies she won't be coughing. Tessalon Perles don't do anything to control allergies..Marland Kitchen

## 2019-03-04 ENCOUNTER — Other Ambulatory Visit: Payer: Self-pay

## 2019-03-04 ENCOUNTER — Ambulatory Visit: Payer: Managed Care, Other (non HMO) | Admitting: Physician Assistant

## 2019-03-04 ENCOUNTER — Encounter: Payer: Self-pay | Admitting: Physician Assistant

## 2019-03-04 VITALS — BP 125/83 | HR 90 | Temp 98.4°F | Resp 16 | Wt 133.8 lb

## 2019-03-04 DIAGNOSIS — J40 Bronchitis, not specified as acute or chronic: Secondary | ICD-10-CM

## 2019-03-04 MED ORDER — PROMETHAZINE-DM 6.25-15 MG/5ML PO SYRP: 5.0000 mL | ORAL_SOLUTION | Freq: Every evening | ORAL | 0 refills | Status: DC | PRN

## 2019-03-04 MED ORDER — PREDNISONE 10 MG (21) PO TBPK: ORAL_TABLET | ORAL | 0 refills | Status: DC

## 2019-03-04 NOTE — Patient Instructions (Signed)
Acute Bronchitis, Adult Acute bronchitis is when air tubes (bronchi) in the lungs suddenly get swollen. The condition can make it hard to breathe. It can also cause these symptoms:  A cough.  Coughing up clear, yellow, or green mucus.  Wheezing.  Chest congestion.  Shortness of breath.  A fever.  Body aches.  Chills.  A sore throat. Follow these instructions at home:  Medicines  Take over-the-counter and prescription medicines only as told by your doctor.  If you were prescribed an antibiotic medicine, take it as told by your doctor. Do not stop taking the antibiotic even if you start to feel better. General instructions  Rest.  Drink enough fluids to keep your pee (urine) pale yellow.  Avoid smoking and secondhand smoke. If you smoke and you need help quitting, ask your doctor. Quitting will help your lungs heal faster.  Use an inhaler, cool mist vaporizer, or humidifier as told by your doctor.  Keep all follow-up visits as told by your doctor. This is important. How is this prevented? To lower your risk of getting this condition again:  Wash your hands often with soap and water. If you cannot use soap and water, use hand sanitizer.  Avoid contact with people who have cold symptoms.  Try not to touch your hands to your mouth, nose, or eyes.  Make sure to get the flu shot every year. Contact a doctor if:  Your symptoms do not get better in 2 weeks. Get help right away if:  You cough up blood.  You have chest pain.  You have very bad shortness of breath.  You become dehydrated.  You faint (pass out) or keep feeling like you are going to pass out.  You keep throwing up (vomiting).  You have a very bad headache.  Your fever or chills gets worse. This information is not intended to replace advice given to you by your health care provider. Make sure you discuss any questions you have with your health care provider. Document Released: 05/20/2008 Document  Revised: 07/16/2017 Document Reviewed: 05/22/2016 Elsevier Interactive Patient Education  2019 Elsevier Inc.  

## 2019-03-04 NOTE — Progress Notes (Signed)
Patient: Carolyn Haynes Female    DOB: [birth year]   43 y.o.   MRN: [id number] Visit Date: 03/04/2019  Today's Provider: Trey Sailors, PA-C   Chief Complaint  Patient presents with  . Cough   Subjective:     Cough  This is a new problem. The current episode started in the past 7 days. The problem has been unchanged. The problem occurs constantly. The cough is non-productive. Associated symptoms include nasal congestion, postnasal drip, rhinorrhea and shortness of breath. Pertinent negatives include no chest pain, chills, ear congestion, ear pain, fever, headaches, heartburn, hemoptysis, myalgias, rash, sore throat, sweats, weight loss or wheezing. The symptoms are aggravated by lying down. She has tried prescription cough suppressant, steroid inhaler and oral steroids for the symptoms. The treatment provided mild relief. Her past medical history is significant for bronchitis and COPD. There is no history of asthma, bronchiectasis, emphysema, environmental allergies or pneumonia.   Reports she called online doctor through employer and got 6 day prednisone taper, tessalon perles and albuterol inhaler. She reports she feels somewhat better but still has coughing. Inhaler does give some relief.  Tessalon perles not helpful.  Allergies  Allergen Reactions  . Carbamazepine     Other reaction(s): Other (See Comments) Made patient sleepy loopy  . Penicillins      Current Outpatient Medications:  .  albuterol (PROVENTIL HFA;VENTOLIN HFA) 108 (90 Base) MCG/ACT inhaler, , Disp: , Rfl:  .  benzonatate (TESSALON) 200 MG capsule, , Disp: , Rfl:  .  cholecalciferol (VITAMIN D) 1000 units tablet, Take 1,000 Units by mouth daily., Disp: , Rfl:  .  levothyroxine (SYNTHROID, LEVOTHROID) 50 MCG tablet, Take 1 tablet (50 mcg total) by mouth daily., Disp: 90 tablet, Rfl: 3 .  methylPREDNISolone (MEDROL DOSEPAK) 4 MG TBPK tablet, , Disp: , Rfl:  .  Specialty Vitamins Products (MAGNESIUM, AMINO  ACID CHELATE,) 133 MG tablet, Take 1 tablet by mouth 2 (two) times daily., Disp: , Rfl:  .  vitamin B-12 (CYANOCOBALAMIN) 1000 MCG tablet, Take by mouth., Disp: , Rfl:  .  predniSONE (STERAPRED UNI-PAK 21 TAB) 10 MG (21) TBPK tablet, Take 6 pills on day 1, 5 on day 2 and so on until complete., Disp: 21 tablet, Rfl: 0 .  promethazine-dextromethorphan (PROMETHAZINE-DM) 6.25-15 MG/5ML syrup, Take 5 mLs by mouth at bedtime as needed., Disp: 118 mL, Rfl: 0  Current Facility-Administered Medications:  .  Levonorgestrel IUD 19.5 mg, 1 each, Intrauterine, Once, Schuman, Christanna R, MD  Review of Systems  Constitutional: Negative for chills, fever and weight loss.  HENT: Positive for postnasal drip and rhinorrhea. Negative for ear pain and sore throat.   Respiratory: Positive for cough and shortness of breath. Negative for hemoptysis and wheezing.   Cardiovascular: Negative for chest pain.  Gastrointestinal: Negative for heartburn.  Musculoskeletal: Negative for myalgias.  Skin: Negative for rash.  Allergic/Immunologic: Negative for environmental allergies.  Neurological: Negative for headaches.    Social History   Tobacco Use  . Smoking status: Never Smoker  . Smokeless tobacco: Never Used  Substance Use Topics  . Alcohol use: No    Alcohol/week: 0.0 standard drinks      Objective:   BP 125/83   Pulse 90   Temp 98.4 F (36.9 C) (Oral)   Resp 16   Wt 133 lb 12.8 oz (60.7 kg)   BMI 24.87 kg/m  Vitals:   03/04/19 1454  BP: 125/83  Pulse: 90  Resp: 16  Temp: 98.4 F (36.9 C)  TempSrc: Oral  Weight: 133 lb 12.8 oz (60.7 kg)     Physical Exam Constitutional:      Appearance: Normal appearance.  Cardiovascular:     Rate and Rhythm: Normal rate and regular rhythm.     Heart sounds: Normal heart sounds.  Pulmonary:     Effort: Pulmonary effort is normal.     Breath sounds: Wheezing present. No rhonchi.  Neurological:     Mental Status: She is alert and oriented to  person, place, and time. Mental status is at baseline.  Psychiatric:        Mood and Affect: Mood normal.        Behavior: Behavior normal.         Assessment & Plan    1. Bronchitis  Wheezing persists, will extend prednisone taper. Advised continued use of albuterol inhaler. Phenergan DM for cough.  - predniSONE (STERAPRED UNI-PAK 21 TAB) 10 MG (21) TBPK tablet; Take 6 pills on day 1, 5 on day 2 and so on until complete.  Dispense: 21 tablet; Refill: 0 - promethazine-dextromethorphan (PROMETHAZINE-DM) 6.25-15 MG/5ML syrup; Take 5 mLs by mouth at bedtime as needed.  Dispense: 118 mL; Refill: 0  The entirety of the information documented in the History of Present Illness, Review of Systems and Physical Exam were personally obtained by me. Portions of this information were initially documented by Sheliah Hatch, CMA and reviewed by me for thoroughness and accuracy.   Return if symptoms worsen or fail to improve.       Trey Sailors, PA-C  [facility]

## 2019-03-16 ENCOUNTER — Telehealth: Payer: Self-pay

## 2019-03-16 DIAGNOSIS — E039 Hypothyroidism, unspecified: Secondary | ICD-10-CM

## 2019-03-16 NOTE — Telephone Encounter (Signed)
Patient is requesting a refill on levothyroxine (SYNTHROID, LEVOTHROID) 50 MCG [facility] Last F/U OV was 11/04/2017 and last TSH was over 1 year ago.

## 2019-03-16 NOTE — Telephone Encounter (Signed)
Has she been out of this medication? If so, I will refill for a month but she will need to be scheduled for follow up in one month for office visit and labs as last TSH was >1 year ago.   If she has not been out of it, then schedule for e-visit and she can get labs on walk in basis.

## 2019-03-17 ENCOUNTER — Telehealth: Payer: Self-pay | Admitting: Physician Assistant

## 2019-03-17 MED ORDER — LEVOTHYROXINE SODIUM 50 MCG PO TABS: 50.0000 ug | ORAL_TABLET | Freq: Every day | ORAL | 0 refills | Status: DC

## 2019-03-17 NOTE — Telephone Encounter (Signed)
Sent that in for her.  

## 2019-03-17 NOTE — Telephone Encounter (Signed)
She is completely out. appt 04/16/2019.

## 2019-03-17 NOTE — Telephone Encounter (Signed)
[facility] for the following medications:  levothyroxine (SYNTHROID, LEVOTHROID) 50 MCG tablet    Please advise.

## 2019-04-08 ENCOUNTER — Other Ambulatory Visit: Payer: Self-pay | Admitting: Physician Assistant

## 2019-04-08 DIAGNOSIS — E039 Hypothyroidism, unspecified: Secondary | ICD-10-CM

## 2019-04-08 NOTE — Telephone Encounter (Signed)
Please review

## 2019-04-14 ENCOUNTER — Other Ambulatory Visit: Payer: Self-pay | Admitting: *Deleted

## 2019-04-14 DIAGNOSIS — E039 Hypothyroidism, unspecified: Secondary | ICD-10-CM

## 2019-04-16 ENCOUNTER — Ambulatory Visit: Payer: Self-pay | Admitting: Physician Assistant

## 2019-04-17 LAB — TSH: TSH: 2.37 u[IU]/mL (ref 0.450–4.500)

## 2019-04-22 ENCOUNTER — Ambulatory Visit: Payer: Self-pay | Admitting: Family Medicine

## 2019-04-22 ENCOUNTER — Ambulatory Visit (INDEPENDENT_AMBULATORY_CARE_PROVIDER_SITE_OTHER): Payer: Managed Care, Other (non HMO) | Admitting: Physician Assistant

## 2019-04-22 DIAGNOSIS — E039 Hypothyroidism, unspecified: Secondary | ICD-10-CM

## 2019-04-22 NOTE — Progress Notes (Signed)
Patient: Carolyn Haynes Female    DOB: 1976-08-10   43 y.o.   MRN: [id number] Visit Date: 04/23/2019  Today's Provider: Trey SailorsAdriana M Pollak, PA-C   Chief Complaint  Patient presents with  . Hypothyroidism   Subjective:    Virtual Visit via Video Note  I connected with Carolyn Haynes on 04/22/19 at  2:20 PM EDT by a video enabled telemedicine application and verified that I am speaking with the correct person using two identifiers.   I discussed the limitations of evaluation and management by telemedicine and the availability of in person appointments. The patient expressed understanding and agreed to proceed.   Patient location: home Provider [facility]  Persons involved in the visit: patient, provider    HPI  Hypothyroid, follow-up:  TSH  Date Value Ref Range Status  04/16/2019 2.370 0.450 - 4.500 uIU/mL Final  01/23/2018 2.880 0.450 - 4.500 uIU/mL Final  07/11/2016 2.750 0.450 - 4.500 uIU/mL Final   Wt Readings from Last 3 Encounters:  03/04/19 133 lb 12.8 oz (60.7 kg)  08/05/18 138 lb (62.6 kg)  07/10/18 136 lb (61.7 kg)    She was last seen for hypothyroid 1 years ago.  Management since that visit includes maintain 50 mcg QD levothyroxine. She reports excellent compliance with treatment. She is not having side effects.  She is exercising. She is experiencing none She denies change in energy level, diarrhea, heat / cold intolerance, palpitations and weight changes Weight trend: stable  ------------------------------------------------------------------------  Allergies  Allergen Reactions  . Carbamazepine     Other reaction(s): Other (See Comments) Made patient sleepy loopy  . Penicillins      Current Outpatient Medications:  .  albuterol (PROVENTIL HFA;VENTOLIN HFA) 108 (90 Base) MCG/ACT inhaler, , Disp: , Rfl:  .  cholecalciferol (VITAMIN D) 1000 units tablet, Take 1,000 Units by mouth daily., Disp: , Rfl:  .   levothyroxine (SYNTHROID) 50 MCG tablet, Take 1 tablet (50 mcg total) by mouth daily., Disp: 90 tablet, Rfl: 0 .  Specialty Vitamins Products (MAGNESIUM, AMINO ACID CHELATE,) 133 MG tablet, Take 1 tablet by mouth 2 (two) times daily., Disp: , Rfl:  .  vitamin B-12 (CYANOCOBALAMIN) 1000 MCG tablet, Take by mouth., Disp: , Rfl:   Current Facility-Administered Medications:  .  Levonorgestrel IUD 19.5 mg, 1 each, Intrauterine, Once, Schuman, Christanna R, MD  Review of Systems  Social History   Tobacco Use  . Smoking status: Never Smoker  . Smokeless tobacco: Never Used  Substance Use Topics  . Alcohol use: No    Alcohol/week: 0.0 standard drinks      Objective:   There were no vitals taken for this visit. There were no vitals filed for this visit.   Physical Exam Constitutional:      Appearance: Normal appearance.  Neck:     Thyroid: Thyromegaly present.  Pulmonary:     Effort: Pulmonary effort is normal. No respiratory distress.  Neurological:     Mental Status: She is alert.  Psychiatric:        Mood and Affect: Mood normal.        Behavior: Behavior normal.         Assessment & Plan    1. Adult hypothyroidism  TSH within normal range, continue levothyroxine 50 mcg daily. Follow up in one year for hypothyroidism.   The entirety of the information documented in the History of Present Illness, Review of Systems and Physical Exam were personally  obtained by me. Portions of this information were initially documented by Sheliah Hatch, CMA and reviewed by me for thoroughness and accuracy.         Trey Sailors, PA-C  [facility]

## 2019-04-23 NOTE — Patient Instructions (Signed)
Hypothyroidism  Hypothyroidism is when the thyroid gland does not make enough of certain hormones (it is underactive). The thyroid gland is a small gland located in the lower front part of the neck, just in front of the windpipe (trachea). This gland makes hormones that help control how the body uses food for energy (metabolism) as well as how the heart and brain function. These hormones also play a role in keeping your bones strong. When the thyroid is underactive, it produces too little of the hormones thyroxine (T4) and triiodothyronine (T3). What are the causes? This condition may be caused by:  Hashimoto's disease. This is a disease in which the body's disease-fighting system (immune system) attacks the thyroid gland. This is the most common cause.  Viral infections.  Pregnancy.  Certain medicines.  Birth defects.  Past radiation treatments to the head or neck for cancer.  Past treatment with radioactive iodine.  Past exposure to radiation in the environment.  Past surgical removal of part or all of the thyroid.  Problems with a gland in the center of the brain (pituitary gland).  Lack of enough iodine in the diet. What increases the risk? You are more likely to develop this condition if:  You are female.  You have a family history of thyroid conditions.  You use a medicine called lithium.  You take medicines that affect the immune system (immunosuppressants). What are the signs or symptoms? Symptoms of this condition include:  Feeling as though you have no energy (lethargy).  Not being able to tolerate cold.  Weight gain that is not explained by a change in diet or exercise habits.  Lack of appetite.  Dry skin.  Coarse hair.  Menstrual irregularity.  Slowing of thought processes.  Constipation.  Sadness or depression. How is this diagnosed? This condition may be diagnosed based on:  Your symptoms, your medical history, and a physical exam.  Blood  tests. You may also have imaging tests, such as an ultrasound or MRI. How is this treated? This condition is treated with medicine that replaces the thyroid hormones that your body does not make. After you begin treatment, it may take several weeks for symptoms to go away. Follow these instructions at home:  Take over-the-counter and prescription medicines only as told by your health care provider.  If you start taking any new medicines, tell your health care provider.  Keep all follow-up visits as told by your health care provider. This is important. ? As your condition improves, your dosage of thyroid hormone medicine may change. ? You will need to have blood tests regularly so that your health care provider can monitor your condition. Contact a health care provider if:  Your symptoms do not get better with treatment.  You are taking thyroid replacement medicine and you: ? Sweat a lot. ? Have tremors. ? Feel anxious. ? Lose weight rapidly. ? Cannot tolerate heat. ? Have emotional swings. ? Have diarrhea. ? Feel weak. Get help right away if you have:  Chest pain.  An irregular heartbeat.  A rapid heartbeat.  Difficulty breathing. Summary  Hypothyroidism is when the thyroid gland does not make enough of certain hormones (it is underactive).  When the thyroid is underactive, it produces too little of the hormones thyroxine (T4) and triiodothyronine (T3).  The most common cause is Hashimoto's disease, a disease in which the body's disease-fighting system (immune system) attacks the thyroid gland. The condition can also be caused by viral infections, medicine, pregnancy, or past   radiation treatment to the head or neck.  Symptoms may include weight gain, dry skin, constipation, feeling as though you do not have energy, and not being able to tolerate cold.  This condition is treated with medicine to replace the thyroid hormones that your body does not make. This information  is not intended to replace advice given to you by your health care provider. Make sure you discuss any questions you have with your health care provider. Document Released: 12/02/2005 Document Revised: 11/12/2017 Document Reviewed: 11/12/2017 Elsevier Interactive Patient Education  2019 Elsevier Inc.  

## 2019-05-30 ENCOUNTER — Other Ambulatory Visit: Payer: Self-pay | Admitting: Physician Assistant

## 2019-05-30 DIAGNOSIS — E039 Hypothyroidism, unspecified: Secondary | ICD-10-CM

## 2019-05-31 NOTE — Telephone Encounter (Signed)
LOV 04/22/2019

## 2019-08-22 ENCOUNTER — Other Ambulatory Visit: Payer: Self-pay | Admitting: Physician Assistant

## 2019-08-22 DIAGNOSIS — E039 Hypothyroidism, unspecified: Secondary | ICD-10-CM

## 2019-08-24 NOTE — Telephone Encounter (Signed)
L.O.V. was 04/22/2019. 

## 2019-09-01 NOTE — Progress Notes (Signed)
Patient: Carolyn Haynes, Female    DOB: Oct 16, 1976, 43 y.o.   MRN: [id number] Visit Date: 09/02/2019  Today's Provider: Trey SailorsAdriana M Madeline Pho, PA-C   Chief Complaint  Patient presents with  . Annual Exam   Subjective:     Annual physical exam Carolyn Haynes is a 43 y.o. female who presents today for health maintenance and complete physical. She feels well. She reports exercising 3 days a week. She reports she is sleeping well. 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 She has an IUD for contraception.  Hypothyroidism: synthroid 50 mcg daily without issue.   Lab Results  Component Value Date   TSH 2.370 04/16/2019    -----------------------------------------------------------------   Review of Systems  Constitutional: Negative.   HENT: Negative.   Eyes: Negative.   Respiratory: Negative.   Cardiovascular: Negative.   Gastrointestinal: Negative.   Endocrine: Negative.   Genitourinary: Negative.   Musculoskeletal: Negative.   Skin: Negative.   Allergic/Immunologic: Positive for environmental allergies and food allergies.  Neurological: Negative.   Hematological: Negative.   Psychiatric/Behavioral: Negative.     Social History      She  reports that she has never smoked. She has never used smokeless tobacco. She reports that she does not drink alcohol or use drugs.       Social History   Socioeconomic History  . Marital status: Married    Spouse name: Not on file  . Number of children: Not on file  . Years of education: Not on file  . Highest education level: Not on file  Occupational History  . Not on file  Social Needs  . Financial resource strain: Not on file  . Food insecurity    Worry: Not on file    Inability: Not on file  . Transportation needs    Medical: Not on file    Non-medical: Not on file  Tobacco Use  . Smoking  status: Never Smoker  . Smokeless tobacco: Never Used  Substance and Sexual Activity  . Alcohol use: No    Alcohol/week: 0.0 standard drinks  . Drug use: Never  . Sexual activity: Yes    Birth control/protection: Pill  Lifestyle  . Physical activity    Days per week: Not on file    Minutes per session: Not on file  . Stress: Not on file  Relationships  . Social Musicianconnections    Talks on phone: Not on file    Gets together: Not on file    Attends religious service: Not on file    Active member of club or organization: Not on file    Attends meetings of clubs or organizations: Not on file    Relationship status: Not on file  Other Topics Concern  . Not on file  Social History Narrative  . Not on file    Past Medical History:  Diagnosis Date  . Allergic rhinitis   . Asthma   . Esophagitis, reflux   . Hypothyroid   . IBS (irritable bowel syndrome)   . Polycystic ovaries      Patient Active Problem List   Diagnosis Date Noted  . Allergic rhinitis 08/02/2015  . Adult hypothyroidism 08/02/2015  . Bilateral polycystic ovarian syndrome 08/02/2015  . Contraception management 06/01/2015    Past Surgical History:  Procedure Laterality Date  . 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 DILATION  AND CURETTAGE OF UTERUS  2000  . NASAL SINUS SURGERY  08/2013    Family History        Family Status  Relation Name Status  . Father  Alive  . Mother  Alive  . Sister twin sister Alive  . MGM  Deceased  . Neg Hx  (Not Specified)        Her family history includes Cancer in her maternal grandmother; Hypertension in her father. There is no history of Allergic rhinitis, Asthma, Eczema, or Urticaria.      Allergies  Allergen Reactions  . Carbamazepine     Other reaction(s): Other (See Comments) Made patient sleepy loopy  . Penicillins      Current Outpatient Medications:  .  albuterol (PROVENTIL HFA;VENTOLIN HFA) 108 (90 Base) MCG/ACT inhaler, , Disp: , Rfl:    .  cholecalciferol (VITAMIN D) 1000 units tablet, Take 1,000 Units by mouth daily., Disp: , Rfl:  .  levothyroxine (SYNTHROID) 50 MCG tablet, TAKE 1 TABLET BY MOUTH EVERY DAY, Disp: 90 tablet, Rfl: 0 .  Specialty Vitamins Products (MAGNESIUM, AMINO ACID CHELATE,) 133 MG tablet, Take 1 tablet by mouth 2 (two) times daily., Disp: , Rfl:  .  vitamin B-12 (CYANOCOBALAMIN) 1000 MCG tablet, Take by mouth., Disp: , Rfl:   Current Facility-Administered Medications:  .  Levonorgestrel IUD 19.5 mg, 1 each, Intrauterine, Once, Schuman, Stefanie Libel, MD   Patient Care Team: Paulene Floor as PCP - General (Physician Assistant)    Objective:    Vitals: BP 114/78 (BP Location: Left Arm, Patient Position: Sitting, Cuff Size: Normal)   Pulse 91   Temp (!) 97.5 F (36.4 C) (Temporal)   Resp 16   Ht 5' 1.5" (1.562 m)   Wt 141 lb 12.8 oz (64.3 kg)   SpO2 98%   BMI 26.36 kg/m    Vitals:   09/02/19 1410  BP: 114/78  Pulse: 91  Resp: 16  Temp: (!) 97.5 F (36.4 C)  TempSrc: Temporal  SpO2: 98%  Weight: 141 lb 12.8 oz (64.3 kg)  Height: 5' 1.5" (1.562 m)     Physical Exam Constitutional:      Appearance: Normal appearance.  Cardiovascular:     Rate and Rhythm: Normal rate and regular rhythm.     Heart sounds: Normal heart sounds.  Pulmonary:     Effort: Pulmonary effort is normal.     Breath sounds: Normal breath sounds.  Abdominal:     General: Bowel sounds are normal.     Palpations: Abdomen is soft.  Skin:    General: Skin is warm and dry.  Neurological:     Mental Status: She is alert and oriented to person, place, and time. Mental status is at baseline.  Psychiatric:        Mood and Affect: Mood normal.        Behavior: Behavior normal.      Depression Screen PHQ 2/9 Scores 09/02/2019  PHQ - 2 Score 0  PHQ- 9 Score 0       Assessment & Plan:     Routine Health Maintenance and Physical Exam  Exercise Activities and Dietary recommendations Goals    None      There is no immunization history on file for this patient.  Health Maintenance  Topic Date Due  . HIV Screening  09/30/1992  . TETANUS/TDAP  09/30/1996  . INFLUENZA VACCINE  07/17/2019  . PAP SMEAR-Modifier  05/25/2021     Discussed health  benefits of physical activity, and encouraged her to engage in regular exercise appropriate for her age and condition.    1. Annual physical exam  Labs below and will complete work form when these return.   - Comprehensive Metabolic Panel (CMET) - TSH - CBC with Differential - Lipid Profile  2. Need for influenza vaccination  - Flu Vaccine QUAD 36+ mos IM  3. Wheezing  - albuterol (VENTOLIN HFA) 108 (90 Base) MCG/ACT inhaler; Inhale 2 puffs into the lungs every 6 (six) hours as needed for wheezing or shortness of breath.  Dispense: 8 g; Refill: 1  4. Other abnormal cytological finding of specimen from cervix  PAP 05/2018 positive for HPV but negative for lesion. 06/2018 Colposcopy was negative for dysplasia and malignancy. Instructed to repeat cotesting in one year. Patient was not aware of this. Advised patient she is due and we can do it here but she would like to follow up with OBGYN. She will schedule this follow up.   5. Hypothyroidism  Normal, labs today. Continue synthroid 50 mcg daily.   The entirety of the information documented in the History of Present Illness, Review of Systems and Physical Exam were personally obtained by me. Portions of this information were initially documented by Rondel Baton, CMA and reviewed by me for thoroughness and accuracy.   Follow up 1 year CPE and thyroid.  --------------------------------------------------------------------    Trey Sailors, PA-C  [facility]

## 2019-09-02 ENCOUNTER — Other Ambulatory Visit: Payer: Self-pay

## 2019-09-02 ENCOUNTER — Encounter: Payer: Self-pay | Admitting: Physician Assistant

## 2019-09-02 ENCOUNTER — Ambulatory Visit (INDEPENDENT_AMBULATORY_CARE_PROVIDER_SITE_OTHER): Payer: Managed Care, Other (non HMO) | Admitting: Physician Assistant

## 2019-09-02 VITALS — BP 114/78 | HR 91 | Temp 97.5°F | Resp 16 | Ht 61.5 in | Wt 141.8 lb

## 2019-09-02 DIAGNOSIS — Z23 Encounter for immunization: Secondary | ICD-10-CM | POA: Diagnosis not present

## 2019-09-02 DIAGNOSIS — R062 Wheezing: Secondary | ICD-10-CM | POA: Diagnosis not present

## 2019-09-02 DIAGNOSIS — E039 Hypothyroidism, unspecified: Secondary | ICD-10-CM

## 2019-09-02 DIAGNOSIS — R87618 Other abnormal cytological findings on specimens from cervix uteri: Secondary | ICD-10-CM

## 2019-09-02 DIAGNOSIS — Z0000 Encounter for general adult medical examination without abnormal findings: Secondary | ICD-10-CM | POA: Diagnosis not present

## 2019-09-02 MED ORDER — ALBUTEROL SULFATE HFA 108 (90 BASE) MCG/ACT IN AERS: 2.0000 | INHALATION_SPRAY | Freq: Four times a day (QID) | RESPIRATORY_TRACT | 1 refills | Status: DC | PRN

## 2019-09-02 NOTE — Patient Instructions (Signed)
Schedule pap    Health Maintenance, Female Adopting a healthy lifestyle and getting preventive care are important in promoting health and wellness. Ask your health care provider about:  The right schedule for you to have regular tests and exams.  Things you can do on your own to prevent diseases and keep yourself healthy. What should I know about diet, weight, and exercise? Eat a healthy diet   Eat a diet that includes plenty of vegetables, fruits, low-fat dairy products, and lean protein.  Do not eat a lot of foods that are high in solid fats, added sugars, or sodium. Maintain a healthy weight Body mass index (BMI) is used to identify weight problems. It estimates body fat based on height and weight. Your health care provider can help determine your BMI and help you achieve or maintain a healthy weight. Get regular exercise Get regular exercise. This is one of the most important things you can do for your health. Most adults should:  Exercise for at least 150 minutes each week. The exercise should increase your heart rate and make you sweat (moderate-intensity exercise).  Do strengthening exercises at least twice a week. This is in addition to the moderate-intensity exercise.  Spend less time sitting. Even light physical activity can be beneficial. Watch cholesterol and blood lipids Have your blood tested for lipids and cholesterol at 42 years of age, then have this test every 5 years. Have your cholesterol levels checked more often if:  Your lipid or cholesterol levels are high.  You are older than 41 years of age.  You are at high risk for heart disease. What should I know about cancer screening? Depending on your health history and family history, you may need to have cancer screening at various ages. This may include screening for:  Breast cancer.  Cervical cancer.  Colorectal cancer.  Skin cancer.  Lung cancer. What should I know about heart disease, diabetes,  and high blood pressure? Blood pressure and heart disease  High blood pressure causes heart disease and increases the risk of stroke. This is more likely to develop in people who have high blood pressure readings, are of African descent, or are overweight.  Have your blood pressure checked: ? Every 3-5 years if you are 83-49 years of age. ? Every year if you are 52 years old or older. Diabetes Have regular diabetes screenings. This checks your fasting blood sugar level. Have the screening done:  Once every three years after age 24 if you are at a normal weight and have a low risk for diabetes.  More often and at a younger age if you are overweight or have a high risk for diabetes. What should I know about preventing infection? Hepatitis B If you have a higher risk for hepatitis B, you should be screened for this virus. Talk with your health care provider to find out if you are at risk for hepatitis B infection. Hepatitis C Testing is recommended for:  Everyone born from [birth year] through 1964.  Anyone with known risk factors for hepatitis C. Sexually transmitted infections (STIs)  Get screened for STIs, including gonorrhea and chlamydia, if: ? You are sexually active and are younger than 42 years of age. ? You are older than 42 years of age and your health care provider tells you that you are at risk for this type of infection. ? Your sexual activity has changed since you were last screened, and you are at increased risk for chlamydia or gonorrhea. Ask  your health care provider if you are at risk.  Ask your health care provider about whether you are at high risk for HIV. Your health care provider may recommend a prescription medicine to help prevent HIV infection. If you choose to take medicine to prevent HIV, you should first get tested for HIV. You should then be tested every 3 months for as long as you are taking the medicine. Pregnancy  If you are about to stop having your period  (premenopausal) and you may become pregnant, seek counseling before you get pregnant.  Take 400 to 800 micrograms (mcg) of folic acid every day if you become pregnant.  Ask for birth control (contraception) if you want to prevent pregnancy. Osteoporosis and menopause Osteoporosis is a disease in which the bones lose minerals and strength with aging. This can result in bone fractures. If you are 65 years old or older, or if you are at risk for osteoporosis and fractures, ask your health care provider if you should:  Be screened for bone loss.  Take a calcium or vitamin D supplement to lower your risk of fractures.  Be given hormone replacement therapy (HRT) to treat symptoms of menopause. Follow these instructions at home: Lifestyle  Do not use any products that contain nicotine or tobacco, such as cigarettes, e-cigarettes, and chewing tobacco. If you need help quitting, ask your health care provider.  Do not use street drugs.  Do not share needles.  Ask your health care provider for help if you need support or information about quitting drugs. Alcohol use  Do not drink alcohol if: ? Your health care provider tells you not to drink. ? You are pregnant, may be pregnant, or are planning to become pregnant.  If you drink alcohol: ? Limit how much you use to 0-1 drink a day. ? Limit intake if you are breastfeeding.  Be aware of how much alcohol is in your drink. In the [place] one drink equals one 12 oz bottle of beer (355 mL), one 5 oz glass of wine (148 mL), or one 1 oz glass of hard liquor (44 mL). General instructions  Schedule regular health, dental, and eye exams.  Stay current with your vaccines.  Tell your health care provider if: ? You often feel depressed. ? You have ever been abused or do not feel safe at home. Summary  Adopting a healthy lifestyle and getting preventive care are important in promoting health and wellness.  Follow your health care provider's  instructions about healthy diet, exercising, and getting tested or screened for diseases.  Follow your health care provider's instructions on monitoring your cholesterol and blood pressure. This information is not intended to replace advice given to you by your health care provider. Make sure you discuss any questions you have with your health care provider. Document Released: 06/17/2011 Document Revised: 11/25/2018 Document Reviewed: 11/25/2018 Elsevier Patient Education  2020 Elsevier Inc.  

## 2019-09-03 ENCOUNTER — Telehealth: Payer: Self-pay

## 2019-09-03 LAB — CBC WITH DIFFERENTIAL/PLATELET
Basophils Absolute: 0 10*3/uL (ref 0.0–0.2)
Basos: 0 %
EOS (ABSOLUTE): 0 10*3/uL (ref 0.0–0.4)
Eos: 0 %
Hematocrit: 41 % (ref 34.0–46.6)
Hemoglobin: 13.9 g/dL (ref 11.1–15.9)
Immature Grans (Abs): 0 10*3/uL (ref 0.0–0.1)
Immature Granulocytes: 0 %
Lymphocytes Absolute: 2.1 10*3/uL (ref 0.7–3.1)
Lymphs: 34 %
MCH: 28.6 pg (ref 26.6–33.0)
MCHC: 33.9 g/dL (ref 31.5–35.7)
MCV: 84 fL (ref 79–97)
Monocytes Absolute: 0.5 10*3/uL (ref 0.1–0.9)
Monocytes: 8 %
Neutrophils Absolute: 3.6 10*3/uL (ref 1.4–7.0)
Neutrophils: 58 %
Platelets: 124 10*3/uL — ABNORMAL LOW (ref 150–450)
RBC: 4.86 x10E6/uL (ref 3.77–5.28)
RDW: 13.5 % (ref 11.7–15.4)
WBC: 6.2 10*3/uL (ref 3.4–10.8)

## 2019-09-03 LAB — COMPREHENSIVE METABOLIC PANEL
ALT: 27 IU/L (ref 0–32)
AST: 25 IU/L (ref 0–40)
Albumin/Globulin Ratio: 2.4 — ABNORMAL HIGH (ref 1.2–2.2)
Albumin: 4.6 g/dL (ref 3.8–4.8)
Alkaline Phosphatase: 68 IU/L (ref 39–117)
BUN/Creatinine Ratio: 18 (ref 9–23)
BUN: 14 mg/dL (ref 6–24)
Bilirubin Total: 0.6 mg/dL (ref 0.0–1.2)
CO2: 20 mmol/L (ref 20–29)
Calcium: 9 mg/dL (ref 8.7–10.2)
Chloride: 107 mmol/L — ABNORMAL HIGH (ref 96–106)
Creatinine, Ser: 0.8 mg/dL (ref 0.57–1.00)
GFR calc Af Amer: 106 mL/min/{1.73_m2} (ref >59)
GFR calc non Af Amer: 92 mL/min/{1.73_m2} (ref >59)
Globulin, Total: 1.9 g/dL (ref 1.5–4.5)
Glucose: 74 mg/dL (ref 65–99)
Potassium: 4.5 mmol/L (ref 3.5–5.2)
Sodium: 140 mmol/L (ref 134–144)
Total Protein: 6.5 g/dL (ref 6.0–8.5)

## 2019-09-03 LAB — LIPID PANEL
Chol/HDL Ratio: 6.3 ratio — ABNORMAL HIGH (ref 0.0–4.4)
Cholesterol, Total: 190 mg/dL (ref 100–199)
HDL: 30 mg/dL — ABNORMAL LOW (ref >39)
LDL Chol Calc (NIH): 138 mg/dL — ABNORMAL HIGH (ref 0–99)
Triglycerides: 119 mg/dL (ref 0–149)
VLDL Cholesterol Cal: 22 mg/dL (ref 5–40)

## 2019-09-03 LAB — TSH: TSH: 2.73 u[IU]/mL (ref 0.450–4.500)

## 2019-09-03 NOTE — Telephone Encounter (Signed)
Patient advised as below.  

## 2019-09-03 NOTE — Telephone Encounter (Signed)
[email or web address]

## 2019-09-03 NOTE — Telephone Encounter (Signed)
-----   Message from Trinna Post, [place] sent at 09/03/2019  8:24 AM EDT ----- Her labs all look normal. Can we please fill out results in her work physical form and I will sign. Form will need to be faxed.

## 2019-10-18 ENCOUNTER — Other Ambulatory Visit: Payer: Self-pay | Admitting: Physician Assistant

## 2019-10-18 DIAGNOSIS — E039 Hypothyroidism, unspecified: Secondary | ICD-10-CM

## 2019-10-18 MED ORDER — LEVOTHYROXINE SODIUM 50 MCG PO TABS: 50.0000 ug | ORAL_TABLET | Freq: Every day | ORAL | 0 refills | Status: DC

## 2019-10-18 NOTE — Telephone Encounter (Signed)
L.O.V. was on 09/02/2019.

## 2019-10-18 NOTE — Telephone Encounter (Signed)
[facility] faxed refill request for the following medications:  levothyroxine (SYNTHROID) 50 MCG tablet   Please advise.  

## 2020-01-12 ENCOUNTER — Other Ambulatory Visit: Payer: Self-pay | Admitting: Physician Assistant

## 2020-01-12 DIAGNOSIS — E039 Hypothyroidism, unspecified: Secondary | ICD-10-CM

## 2020-01-12 NOTE — Telephone Encounter (Signed)
Requested Prescriptions  Pending Prescriptions Disp Refills  . levothyroxine (SYNTHROID) 50 MCG tablet [Pharmacy Med Name: LEVOTHYROXINE 50 MCG TABLET] 90 tablet 0    Sig: TAKE 1 TABLET BY MOUTH EVERY DAY     Endocrinology:  Hypothyroid Agents Failed - 01/12/2020  1:31 AM      Failed - TSH needs to be rechecked within 3 months after an abnormal result. Refill until TSH is due.      Passed - TSH in normal range and within 360 days    TSH  Date Value Ref Range Status  09/02/2019 2.730 0.450 - 4.500 uIU/mL Final         Passed - Valid encounter within last 12 months    Recent Outpatient Visits          4 months ago Annual physical exam   [facility] [place]st [place], [place]   8 months ago Adult hypothyroidism   [facility] [place]do Angst [place], [place]   10 months ago Bronchitis   [facility] [place], [place], [place]   1 year ago Annual physical exam   [facility] [place], [place], [place]   2 years ago Acute sinusitis, recurrence not specified, unspecified location   [facility] [place], [place], [place]

## 2020-02-01 NOTE — Telephone Encounter (Signed)
Carolyn Haynes rcvd/charged 08/05/18

## 2020-04-12 ENCOUNTER — Other Ambulatory Visit: Payer: Self-pay | Admitting: Physician Assistant

## 2020-04-12 DIAGNOSIS — E039 Hypothyroidism, unspecified: Secondary | ICD-10-CM

## 2020-04-12 NOTE — Telephone Encounter (Signed)
Requested Prescriptions  Pending Prescriptions Disp Refills  . levothyroxine (SYNTHROID) 50 MCG tablet [Pharmacy Med Name: LEVOTHYROXINE 50 MCG TABLET] 90 tablet 1    Sig: TAKE 1 TABLET BY MOUTH EVERY DAY     Endocrinology:  Hypothyroid Agents Failed - 04/12/2020  2:00 AM      Failed - TSH needs to be rechecked within 3 months after an abnormal result. Refill until TSH is due.      Passed - TSH in normal range and within 360 days    TSH  Date Value Ref Range Status  09/02/2019 2.730 0.450 - 4.500 uIU/mL Final         Passed - Valid encounter within last 12 months    Recent Outpatient Visits          7 months ago Annual physical exam   [facility] Osvaldo Angst M, [place]   11 months ago Adult hypothyroidism   [facility] [place], Lavella Hammock, [place]   1 year ago Bronchitis   [facility] [place], Lavella Hammock, [place]   1 year ago Annual physical exam   [facility] [place], [place], [place]   2 years ago Acute sinusitis, recurrence not specified, unspecified location   [facility] [place], [place], [place]

## 2020-09-01 NOTE — Progress Notes (Signed)
Complete physical exam   Patient: Carolyn Haynes   DOB: 1976-05-11   44 y.o. Female  MRN: [id number] Visit Date: 09/04/2020  Today's healthcare provider: Trey Sailors, PA-C   Chief Complaint  Patient presents with  . Annual Exam  I,Carolyn Haynes,acting as a scribe for Trey Sailors, PA-C.,have documented all relevant documentation on the behalf of Trey Sailors, PA-C,as directed by  Trey Sailors, PA-C while in the presence of Trey Sailors, PA-C.  Subjective    Carolyn Haynes is a 44 y.o. female who presents today for a complete physical exam.  She reports consuming a general diet. Gym/ health club routine includes stationary bike and walk. She generally feels well. She reports sleeping well. She does have additional problems to discuss today.    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 She underwent colposcopy at [facility] in 06/2018 with negative pathology. Recommended to repeat PAP smear with HPV in one year. She has not done this yet.   Denies history of breast cancer in family. Colon cancer in maternal grandmother.   Per Dr Shah:At next lab draw with primary care physician patient should have Vit B12, Vit D and Oxcarbazepine levels checked. Currently on trileptal for trigeminal neuralgia. Previously had surgery for left sided trigeminal neuralgia with [facility].  Hypothyroid, follow-up  Lab Results  Component Value Date   TSH 2.730 09/02/2019   TSH 2.370 04/16/2019   TSH 2.880 01/23/2018   FREET4 1.42 01/23/2018   FREET4 1.39 07/11/2016   Wt Readings from Last 3 Encounters:  09/04/20 138 lb 3.2 oz (62.7 kg)  09/02/19 141 lb 12.8 oz (64.3 kg)  03/04/19 133 lb 12.8 oz (60.7 kg)    She was last seen for hypothyroid 1 years ago.  Management since that visit includes continue synthroid 50 mcg daily. She reports excellent compliance with treatment. She is not having side effects.   Symptoms: No change in energy level No constipation  No  diarrhea No heat / cold intolerance  No nervousness No palpitations  No weight changes    -----------------------------------------------------------------------------------------   Past Medical History:  Diagnosis Date  . Allergic rhinitis   . Asthma   . Esophagitis, reflux   . Hypothyroid   . IBS (irritable bowel syndrome)   . Polycystic ovaries    Past Surgical History:  Procedure Laterality Date  . 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 DILATION AND CURETTAGE OF UTERUS  2000  . NASAL SINUS SURGERY  08/2013   Social History   Socioeconomic History  . Marital status: Married    Spouse name: Not on file  . Number of children: Not on file  . Years of education: Not on file  . Highest education level: Not on file  Occupational History  . Not on file  Tobacco Use  . Smoking status: Never Smoker  . Smokeless tobacco: Never Used  Vaping Use  . Vaping Use: Never used  Substance and Sexual Activity  . Alcohol use: No    Alcohol/week: 0.0 standard drinks  . Drug use: Never  . Sexual activity: Yes    Birth control/protection: Pill  Other Topics Concern  . Not on file  Social History Narrative  . Not on file   Social Determinants of Health   Financial Resource Strain:   . Difficulty of Paying Living Expenses: Not on file  Food Insecurity:   . Worried About Programme researcher, broadcasting/film/video  in the Last Year: Not on file  . Ran Out of Food in the Last Year: Not on file  Transportation Needs:   . Lack of Transportation (Medical): Not on file  . Lack of Transportation (Non-Medical): Not on file  Physical Activity:   . Days of Exercise per Week: Not on file  . Minutes of Exercise per Session: Not on file  Stress:   . Feeling of Stress : Not on file  Social Connections:   . Frequency of Communication with Friends and Family: Not on file  . Frequency of Social Gatherings with Friends and Family: Not on file  . Attends Religious Services: Not on file  . Active  Member of Clubs or Organizations: Not on file  . Attends BankerClub or Organization Meetings: Not on file  . Marital Status: Not on file  Intimate Partner Violence:   . Fear of Current or Ex-Partner: Not on file  . Emotionally Abused: Not on file  . Physically Abused: Not on file  . Sexually Abused: Not on file   Family Status  Relation Name Status  . Father  Alive  . Mother  Alive  . Sister twin sister Alive  . MGM  Deceased  . Neg Hx  (Not Specified)   Family History  Problem Relation Age of Onset  . Hypertension Father   . Bladder Cancer Father   . Cancer Maternal Grandmother        pancreatic  . Allergic rhinitis Neg Hx   . Asthma Neg Hx   . Eczema Neg Hx   . Urticaria Neg Hx    Allergies  Allergen Reactions  . Carbamazepine     Other reaction(s): Other (See Comments) Made patient sleepy loopy  . Penicillins     Patient Care Team: Maryella ShiversPollak, Kimari Lienhard M, PA-C as PCP - General (Physician Assistant)   Medications: Outpatient Medications Prior to Visit  Medication Sig  . albuterol (VENTOLIN HFA) 108 (90 Base) MCG/ACT inhaler Inhale 2 puffs into the lungs every 6 (six) hours as needed for wheezing or shortness of breath.  . cholecalciferol (VITAMIN D) 1000 units tablet Take 1,000 Units by mouth daily.  Marland Kitchen. levothyroxine (SYNTHROID) 50 MCG tablet TAKE 1 TABLET BY MOUTH EVERY DAY  . Specialty Vitamins Products (MAGNESIUM, AMINO ACID CHELATE,) 133 MG tablet Take 1 tablet by mouth 2 (two) times daily.  . vitamin B-12 (CYANOCOBALAMIN) 1000 MCG tablet Take by mouth.   Facility-Administered Medications Prior to Visit  Medication Dose Route Frequency Provider  . Levonorgestrel IUD 19.5 mg  1 each Intrauterine Once Schuman, Christanna R, MD    Review of Systems  Constitutional: Negative.   HENT: Negative.   Eyes: Negative.   Respiratory: Negative.   Cardiovascular: Negative.   Gastrointestinal: Negative.   Endocrine: Negative.   Genitourinary: Negative.   Musculoskeletal:  Negative.   Skin: Negative.   Allergic/Immunologic: Negative.   Neurological: Negative.   Hematological: Negative.   Psychiatric/Behavioral: Negative.       Objective    BP 131/70 (BP Location: Right Arm, Patient Position: Sitting, Cuff Size: Normal)   Pulse 84   Temp 98.4 F (36.9 C) (Oral)   Ht 5' 1.5" (1.562 m)   Wt 138 lb 3.2 oz (62.7 kg)   SpO2 98%   BMI 25.69 kg/m    Physical Exam Constitutional:      Appearance: Normal appearance.  HENT:     Right Ear: Tympanic membrane, ear canal and external ear normal.     Left  Ear: Tympanic membrane, ear canal and external ear normal.  Cardiovascular:     Rate and Rhythm: Normal rate and regular rhythm.     Pulses: Normal pulses.     Heart sounds: Normal heart sounds.  Pulmonary:     Effort: Pulmonary effort is normal.     Breath sounds: Normal breath sounds.  Abdominal:     General: Abdomen is flat. Bowel sounds are normal.     Palpations: Abdomen is soft.  Skin:    General: Skin is warm and dry.  Neurological:     General: No focal deficit present.     Mental Status: She is alert and oriented to person, place, and time.  Psychiatric:        Mood and Affect: Mood normal.        Behavior: Behavior normal.       Last depression screening scores PHQ 2/9 Scores 09/04/2020 09/02/2019  PHQ - 2 Score 0 0  PHQ- 9 Score 0 0   Last fall risk screening Fall Risk  09/04/2020  Falls in the past year? 0  Number falls in past yr: 0  Injury with Fall? 0  Risk for fall due to : No Fall Risks  Follow up Falls evaluation completed   Last Audit-C alcohol use screening Alcohol Use Disorder Test (AUDIT) 09/04/2020  1. How often do you have a drink containing alcohol? 1  2. How many drinks containing alcohol do you have on a typical day when you are drinking? 0  3. How often do you have six or more drinks on one occasion? 0  AUDIT-C Score 1   A score of 3 or more in women, and 4 or more in men indicates increased risk for  alcohol abuse, EXCEPT if all of the points are from question 1   No results found for any visits on 09/04/20.  Assessment & Plan    Routine Health Maintenance and Physical Exam  Exercise Activities and Dietary recommendations Goals   None     Immunization History  Administered Date(s) Administered  . Influenza,inj,Quad PF,6+ Mos 09/02/2019    Health Maintenance  Topic Date Due  . Hepatitis C Screening  Never done  . COVID-19 Vaccine (1) Never done  . HIV Screening  Never done  . TETANUS/TDAP  Never done  . INFLUENZA VACCINE  07/16/2020  . PAP SMEAR-Modifier  05/26/2023    Discussed health benefits of physical activity, and encouraged her to engage in regular exercise appropriate for her age and condition.   1. Annual physical exam  - HIV Antibody (routine testing w rflx) - TSH - Lipid panel - Comprehensive metabolic panel - CBC with Differential/Platelet - Hepatitis C antibody - Vitamin B12 - VITAMIN D 25 Hydroxy (Vit-D Deficiency, Fractures) - 10-Hydroxycarbazepine  2. Adult hypothyroidism  Recheck TSH. Adjust synthroid pending results.  - TSH  3. Trigeminal neuralgia  Labs as below per neurologist. Will forward notes to Dr. Sherryll Burger.  - Vitamin B12 - VITAMIN D 25 Hydroxy (Vit-D Deficiency, Fractures) - 10-Hydroxycarbazepine  4. Pap smear abnormality of cervix/human papillomavirus (HPV) positive  History of normal colposcopy 06/2018. Advised patient she is due for cotesting. Offered PAP smear today but patient would like to get this with OBGYN.   No follow-ups on file.     ITrey Sailors, PA-C, have reviewed all documentation for this visit. The documentation on 09/04/20 for the exam, diagnosis, procedures, and orders are all accurate and complete.  The entirety of the  information documented in the History of Present Illness, Review of Systems and Physical Exam were personally obtained by me. Portions of this information were initially documented by  [facility] and reviewed by me for thoroughness and accuracy.     Maryella Shivers  [facility] [phone] (phone) [phone] (fax)  [facility]

## 2020-09-04 ENCOUNTER — Encounter: Payer: Self-pay | Admitting: Physician Assistant

## 2020-09-04 ENCOUNTER — Other Ambulatory Visit: Payer: Self-pay

## 2020-09-04 ENCOUNTER — Ambulatory Visit (INDEPENDENT_AMBULATORY_CARE_PROVIDER_SITE_OTHER): Payer: Managed Care, Other (non HMO) | Admitting: Physician Assistant

## 2020-09-04 VITALS — BP 131/70 | HR 84 | Temp 98.4°F | Ht 61.5 in | Wt 138.2 lb

## 2020-09-04 DIAGNOSIS — G500 Trigeminal neuralgia: Secondary | ICD-10-CM

## 2020-09-04 DIAGNOSIS — R8789 Other abnormal findings in specimens from female genital organs: Secondary | ICD-10-CM

## 2020-09-04 DIAGNOSIS — R87618 Other abnormal cytological findings on specimens from cervix uteri: Secondary | ICD-10-CM

## 2020-09-04 DIAGNOSIS — Z0000 Encounter for general adult medical examination without abnormal findings: Secondary | ICD-10-CM | POA: Diagnosis not present

## 2020-09-04 DIAGNOSIS — E039 Hypothyroidism, unspecified: Secondary | ICD-10-CM | POA: Diagnosis not present

## 2020-09-04 NOTE — Patient Instructions (Signed)
Health Maintenance, Female Adopting a healthy lifestyle and getting preventive care are important in promoting health and wellness. Ask your health care provider about:  The right schedule for you to have regular tests and exams.  Things you can do on your own to prevent diseases and keep yourself healthy. What should I know about diet, weight, and exercise? Eat a healthy diet   Eat a diet that includes plenty of vegetables, fruits, low-fat dairy products, and lean protein.  Do not eat a lot of foods that are high in solid fats, added sugars, or sodium. Maintain a healthy weight Body mass index (BMI) is used to identify weight problems. It estimates body fat based on height and weight. Your health care provider can help determine your BMI and help you achieve or maintain a healthy weight. Get regular exercise Get regular exercise. This is one of the most important things you can do for your health. Most adults should:  Exercise for at least 150 minutes each week. The exercise should increase your heart rate and make you sweat (moderate-intensity exercise).  Do strengthening exercises at least twice a week. This is in addition to the moderate-intensity exercise.  Spend less time sitting. Even light physical activity can be beneficial. Watch cholesterol and blood lipids Have your blood tested for lipids and cholesterol at 43 years of age, then have this test every 5 years. Have your cholesterol levels checked more often if:  Your lipid or cholesterol levels are high.  You are older than 43 years of age.  You are at high risk for heart disease. What should I know about cancer screening? Depending on your health history and family history, you may need to have cancer screening at various ages. This may include screening for:  Breast cancer.  Cervical cancer.  Colorectal cancer.  Skin cancer.  Lung cancer. What should I know about heart disease, diabetes, and high blood  pressure? Blood pressure and heart disease  High blood pressure causes heart disease and increases the risk of stroke. This is more likely to develop in people who have high blood pressure readings, are of African descent, or are overweight.  Have your blood pressure checked: ? Every 3-5 years if you are 18-39 years of age. ? Every year if you are 40 years old or older. Diabetes Have regular diabetes screenings. This checks your fasting blood sugar level. Have the screening done:  Once every three years after age 40 if you are at a normal weight and have a low risk for diabetes.  More often and at a younger age if you are overweight or have a high risk for diabetes. What should I know about preventing infection? Hepatitis B If you have a higher risk for hepatitis B, you should be screened for this virus. Talk with your health care provider to find out if you are at risk for hepatitis B infection. Hepatitis C Testing is recommended for:  Everyone born from 1945 through 1965.  Anyone with known risk factors for hepatitis C. Sexually transmitted infections (STIs)  Get screened for STIs, including gonorrhea and chlamydia, if: ? You are sexually active and are younger than 43 years of age. ? You are older than 43 years of age and your health care provider tells you that you are at risk for this type of infection. ? Your sexual activity has changed since you were last screened, and you are at increased risk for chlamydia or gonorrhea. Ask your health care provider if   you are at risk.  Ask your health care provider about whether you are at high risk for HIV. Your health care provider may recommend a prescription medicine to help prevent HIV infection. If you choose to take medicine to prevent HIV, you should first get tested for HIV. You should then be tested every 3 months for as long as you are taking the medicine. Pregnancy  If you are about to stop having your period (premenopausal) and  you may become pregnant, seek counseling before you get pregnant.  Take 400 to 800 micrograms (mcg) of folic acid every day if you become pregnant.  Ask for birth control (contraception) if you want to prevent pregnancy. Osteoporosis and menopause Osteoporosis is a disease in which the bones lose minerals and strength with aging. This can result in bone fractures. If you are 65 years old or older, or if you are at risk for osteoporosis and fractures, ask your health care provider if you should:  Be screened for bone loss.  Take a calcium or vitamin D supplement to lower your risk of fractures.  Be given hormone replacement therapy (HRT) to treat symptoms of menopause. Follow these instructions at home: Lifestyle  Do not use any products that contain nicotine or tobacco, such as cigarettes, e-cigarettes, and chewing tobacco. If you need help quitting, ask your health care provider.  Do not use street drugs.  Do not share needles.  Ask your health care provider for help if you need support or information about quitting drugs. Alcohol use  Do not drink alcohol if: ? Your health care provider tells you not to drink. ? You are pregnant, may be pregnant, or are planning to become pregnant.  If you drink alcohol: ? Limit how much you use to 0-1 drink a day. ? Limit intake if you are breastfeeding.  Be aware of how much alcohol is in your drink. In the [place] one drink equals one 12 oz bottle of beer (355 mL), one 5 oz glass of wine (148 mL), or one 1 oz glass of hard liquor (44 mL). General instructions  Schedule regular health, dental, and eye exams.  Stay current with your vaccines.  Tell your health care provider if: ? You often feel depressed. ? You have ever been abused or do not feel safe at home. Summary  Adopting a healthy lifestyle and getting preventive care are important in promoting health and wellness.  Follow your health care provider's instructions about healthy  diet, exercising, and getting tested or screened for diseases.  Follow your health care provider's instructions on monitoring your cholesterol and blood pressure. This information is not intended to replace advice given to you by your health care provider. Make sure you discuss any questions you have with your health care provider. Document Revised: 11/25/2018 Document Reviewed: 11/25/2018 Elsevier Patient Education  2020 Elsevier Inc.  

## 2020-09-08 LAB — CBC WITH DIFFERENTIAL/PLATELET
Basophils Absolute: 0 10*3/uL (ref 0.0–0.2)
Basos: 0 %
EOS (ABSOLUTE): 0 10*3/uL (ref 0.0–0.4)
Eos: 0 %
Hematocrit: 42.1 % (ref 34.0–46.6)
Hemoglobin: 14.2 g/dL (ref 11.1–15.9)
Immature Grans (Abs): 0 10*3/uL (ref 0.0–0.1)
Immature Granulocytes: 0 %
Lymphocytes Absolute: 1.6 10*3/uL (ref 0.7–3.1)
Lymphs: 30 %
MCH: 29.9 pg (ref 26.6–33.0)
MCHC: 33.7 g/dL (ref 31.5–35.7)
MCV: 89 fL (ref 79–97)
Monocytes Absolute: 0.3 10*3/uL (ref 0.1–0.9)
Monocytes: 7 %
Neutrophils Absolute: 3.3 10*3/uL (ref 1.4–7.0)
Neutrophils: 63 %
Platelets: 122 10*3/uL — ABNORMAL LOW (ref 150–450)
RBC: 4.75 x10E6/uL (ref 3.77–5.28)
RDW: 12.3 % (ref 11.7–15.4)
WBC: 5.2 10*3/uL (ref 3.4–10.8)

## 2020-09-08 LAB — LIPID PANEL
Chol/HDL Ratio: 5 ratio — ABNORMAL HIGH (ref 0.0–4.4)
Cholesterol, Total: 164 mg/dL (ref 100–199)
HDL: 33 mg/dL — ABNORMAL LOW (ref >39)
LDL Chol Calc (NIH): 115 mg/dL — ABNORMAL HIGH (ref 0–99)
Triglycerides: 87 mg/dL (ref 0–149)
VLDL Cholesterol Cal: 16 mg/dL (ref 5–40)

## 2020-09-08 LAB — VITAMIN B12: Vitamin B-12: >2000 pg/mL — ABNORMAL HIGH (ref 232–1245)

## 2020-09-08 LAB — COMPREHENSIVE METABOLIC PANEL
ALT: 27 IU/L (ref 0–32)
AST: 20 IU/L (ref 0–40)
Albumin/Globulin Ratio: 2.4 — ABNORMAL HIGH (ref 1.2–2.2)
Albumin: 4.7 g/dL (ref 3.8–4.8)
Alkaline Phosphatase: 60 IU/L (ref 44–121)
BUN/Creatinine Ratio: 18 (ref 9–23)
BUN: 12 mg/dL (ref 6–24)
Bilirubin Total: 0.6 mg/dL (ref 0.0–1.2)
CO2: 19 mmol/L — ABNORMAL LOW (ref 20–29)
Calcium: 9 mg/dL (ref 8.7–10.2)
Chloride: 107 mmol/L — ABNORMAL HIGH (ref 96–106)
Creatinine, Ser: 0.66 mg/dL (ref 0.57–1.00)
GFR calc Af Amer: 126 mL/min/{1.73_m2} (ref >59)
GFR calc non Af Amer: 109 mL/min/{1.73_m2} (ref >59)
Globulin, Total: 2 g/dL (ref 1.5–4.5)
Glucose: 80 mg/dL (ref 65–99)
Potassium: 4.2 mmol/L (ref 3.5–5.2)
Sodium: 140 mmol/L (ref 134–144)
Total Protein: 6.7 g/dL (ref 6.0–8.5)

## 2020-09-08 LAB — TSH: TSH: 3.2 u[IU]/mL (ref 0.450–4.500)

## 2020-09-08 LAB — HIV ANTIBODY (ROUTINE TESTING W REFLEX): HIV Screen 4th Generation wRfx: NONREACTIVE

## 2020-09-08 LAB — 10-HYDROXYCARBAZEPINE: Oxcarbazepine SerPl-Mcnc: <1 ug/mL — ABNORMAL LOW (ref 10–35)

## 2020-09-08 LAB — HEPATITIS C ANTIBODY: Hep C Virus Ab: <0.1 s/co ratio (ref 0.0–0.9)

## 2020-09-08 LAB — VITAMIN D 25 HYDROXY (VIT D DEFICIENCY, FRACTURES): Vit D, 25-Hydroxy: 71.6 ng/mL (ref 30.0–100.0)

## 2020-09-12 ENCOUNTER — Telehealth: Payer: Self-pay

## 2020-09-12 NOTE — Telephone Encounter (Signed)
Please review labs from 09/04/20 if you have not already done so. KW

## 2020-09-12 NOTE — Telephone Encounter (Signed)
Copied from CRM [id number]. Topic: General - Other >> Sep 12, 2020 10:52 AM Jaquita Rector A wrote: Reason for CRM: Patient called to inquire of Osvaldo Angst about her labs done on 09/04/20 please call ph# [phone]

## 2020-09-14 ENCOUNTER — Ambulatory Visit: Payer: Self-pay

## 2020-09-15 NOTE — Telephone Encounter (Signed)
Resulted

## 2020-09-15 NOTE — Telephone Encounter (Signed)
Pt calling back for results of her labs done 09/04/20. Pt would appreciate a call back to discuss and go over.

## 2020-09-18 NOTE — Telephone Encounter (Signed)
Advised pt of results.   HIV negative, thyroid labs stable. Cholesterol stable. Metabolic panel stable. Blood counts stable, Hep c negative. B12 elevated consistent with supplement use. Vitamin D normal. Oxcarbazepine levels undetectable. Dr. Sherryll Burger should be able to see these results through [facility].

## 2020-09-27 ENCOUNTER — Telehealth: Payer: Self-pay | Admitting: Physician Assistant

## 2020-09-27 ENCOUNTER — Other Ambulatory Visit: Payer: Self-pay | Admitting: Physician Assistant

## 2020-09-27 DIAGNOSIS — E039 Hypothyroidism, unspecified: Secondary | ICD-10-CM

## 2020-09-27 NOTE — Telephone Encounter (Signed)
*  sorry- she wants to know if she should take something to help STOP the swelling.

## 2020-09-27 NOTE — Telephone Encounter (Signed)
Advised pt as below. She reports that she feels that something is off because she has had swelling in her ankles for several days. If it is not her thyroid, what else could it be? Went over labs from 9/30 with pt, and advised of results. She wants to know if it could be anything else medication wise that could be making her ankles swell? Please review. Thanks!

## 2020-09-27 NOTE — Telephone Encounter (Signed)
Pt called to f/u about her thyroid medication and if it was going to be changes made to the dosing or directions/ please advise

## 2020-09-27 NOTE — Telephone Encounter (Signed)
Unsure, possibly some venous dysfunction. Can wear compression stockings and elevated legs whenever possible. Her other medications shouldn't cause swelling.

## 2020-09-27 NOTE — Telephone Encounter (Signed)
No her TSH was stable, continue same dose.

## 2020-09-29 ENCOUNTER — Other Ambulatory Visit: Payer: Self-pay

## 2020-09-29 DIAGNOSIS — E039 Hypothyroidism, unspecified: Secondary | ICD-10-CM

## 2020-09-29 MED ORDER — LEVOTHYROXINE SODIUM 50 MCG PO TABS: 50.0000 ug | ORAL_TABLET | Freq: Every day | ORAL | 2 refills | Status: DC

## 2020-09-29 NOTE — Telephone Encounter (Signed)
Advised and sent thyroid med refills to [facility] [place].

## 2020-10-18 ENCOUNTER — Telehealth: Payer: Self-pay

## 2020-10-18 DIAGNOSIS — E039 Hypothyroidism, unspecified: Secondary | ICD-10-CM

## 2020-10-18 NOTE — Telephone Encounter (Signed)
Copied from CRM [id number]. Topic: General - Inquiry >> Oct 18, 2020  3:50 PM Leary Roca wrote: Reason for CRM: Pt is wanting to get a referral for thyroids specific. She is wanting a call back from office . Please advise .

## 2020-10-19 NOTE — Telephone Encounter (Signed)
Patient reports she would like the referral to Dr. Talmage Nap w/ [facility]. Referral was placed.

## 2020-10-19 NOTE — Telephone Encounter (Signed)
She wants a referral for endocrinology? Is that correct? If so, fine to place.

## 2020-11-17 ENCOUNTER — Other Ambulatory Visit: Payer: Self-pay | Admitting: Internal Medicine

## 2020-11-17 DIAGNOSIS — E041 Nontoxic single thyroid nodule: Secondary | ICD-10-CM

## 2020-12-05 ENCOUNTER — Ambulatory Visit
Admission: RE | Admit: 2020-12-05 | Discharge: 2020-12-05 | Disposition: A | Discharge status: Home / Self Care | Payer: Managed Care, Other (non HMO) | Source: Ambulatory Visit | Attending: Internal Medicine | Admitting: Internal Medicine

## 2020-12-05 DIAGNOSIS — E041 Nontoxic single thyroid nodule: Secondary | ICD-10-CM

## 2021-04-12 ENCOUNTER — Other Ambulatory Visit: Payer: Self-pay

## 2021-04-12 ENCOUNTER — Ambulatory Visit: Payer: Managed Care, Other (non HMO) | Admitting: Obstetrics and Gynecology

## 2021-04-12 ENCOUNTER — Encounter: Payer: Self-pay | Admitting: Obstetrics and Gynecology

## 2021-04-12 ENCOUNTER — Other Ambulatory Visit (HOSPITAL_COMMUNITY)
Admission: RE | Admit: 2021-04-12 | Discharge: 2021-04-12 | Disposition: A | Discharge status: Home / Self Care | Payer: Managed Care, Other (non HMO) | Source: Ambulatory Visit | Attending: Obstetrics and Gynecology | Admitting: Obstetrics and Gynecology

## 2021-04-12 VITALS — BP 120/80 | Ht 61.5 in | Wt 137.0 lb

## 2021-04-12 DIAGNOSIS — Z30431 Encounter for routine checking of intrauterine contraceptive device: Secondary | ICD-10-CM | POA: Diagnosis not present

## 2021-04-12 DIAGNOSIS — R87618 Other abnormal cytological findings on specimens from cervix uteri: Secondary | ICD-10-CM | POA: Insufficient documentation

## 2021-04-12 DIAGNOSIS — Z124 Encounter for screening for malignant neoplasm of cervix: Secondary | ICD-10-CM | POA: Insufficient documentation

## 2021-04-12 NOTE — Progress Notes (Signed)
Patient ID: Carolyn Haynes, female   DOB: 1976-03-21, 45 y.o.   MRN: [id number]  Reason for Consult: Abnormal Pap Smear and Contraception   Referred by Trey Sailors, PA-C  Subjective:     HPI:  Carolyn Haynes is a 45 y.o. female. She is here for a repeat pap smear and an IUD check. She has been happy with her IUD, for several years she had no periods. Recently her periods returned, but they are lighter. She has a Landscape architect in place.   Past Medical History:  Diagnosis Date  . Allergic rhinitis   . Asthma   . Esophagitis, reflux   . Hypothyroid   . IBS (irritable bowel syndrome)   . Polycystic ovaries    Family History  Problem Relation Age of Onset  . Hypertension Father   . Bladder Cancer Father   . Cancer Maternal Grandmother        pancreatic  . Allergic rhinitis Neg Hx   . Asthma Neg Hx   . Eczema Neg Hx   . Urticaria Neg Hx    Past Surgical History:  Procedure Laterality Date  . 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 DILATION AND CURETTAGE OF UTERUS  2000  . NASAL SINUS SURGERY  08/2013    Short Social History:  Social History   Tobacco Use  . Smoking status: Never Smoker  . Smokeless tobacco: Never Used  Substance Use Topics  . Alcohol use: No    Alcohol/week: 0.0 standard drinks    Allergies  Allergen Reactions  . Carbamazepine     Other reaction(s): Other (See Comments) Made patient sleepy loopy  . Penicillins     Current Outpatient Medications  Medication Sig Dispense Refill  . albuterol (VENTOLIN HFA) 108 (90 Base) MCG/ACT inhaler Inhale 2 puffs into the lungs every 6 (six) hours as needed for wheezing or shortness of breath. 8 g 1  . cholecalciferol (VITAMIN D) 1000 units tablet Take 1,000 Units by mouth daily.    Marland Kitchen levothyroxine (SYNTHROID) 50 MCG tablet Take 1 tablet (50 mcg total) by mouth daily. 90 tablet 2  . OXcarbazepine (TRILEPTAL) 150 MG tablet 1 tablet    . Specialty Vitamins Products (MAGNESIUM, AMINO ACID  CHELATE,) 133 MG tablet Take 1 tablet by mouth 2 (two) times daily.    . vitamin B-12 (CYANOCOBALAMIN) 1000 MCG tablet Take by mouth.     Current Facility-Administered Medications  Medication Dose Route Frequency Provider Last Rate Last Admin  . Levonorgestrel IUD 19.5 mg  1 each Intrauterine Once Melat Wrisley R, MD        Review of Systems  Constitutional: Negative for chills, fatigue, fever and unexpected weight change.  HENT: Negative for trouble swallowing.  Eyes: Negative for loss of vision.  Respiratory: Negative for cough, shortness of breath and wheezing.  Cardiovascular: Negative for chest pain, leg swelling, palpitations and syncope.  GI: Negative for abdominal pain, blood in stool, diarrhea, nausea and vomiting.  GU: Negative for difficulty urinating, dysuria, frequency and hematuria.  Musculoskeletal: Negative for back pain, leg pain and joint pain.  Skin: Negative for rash.  Neurological: Negative for dizziness, headaches, light-headedness, numbness and seizures.  Psychiatric: Negative for behavioral problem, confusion, depressed mood and sleep disturbance.        Objective:  Objective   Vitals:   04/12/21 1508  BP: 120/80  Weight: 137 lb (62.1 kg)  Height: 5' 1.5" (1.562 m)   Body mass index is 25.47 kg/m.  Physical Exam Vitals and nursing note reviewed. Exam conducted with a chaperone present.  Constitutional:      Appearance: Normal appearance. She is well-developed.  HENT:     Head: Normocephalic and atraumatic.  Eyes:     Extraocular Movements: Extraocular movements intact.     Pupils: Pupils are equal, round, and reactive to light.  Cardiovascular:     Rate and Rhythm: Normal rate and regular rhythm.  Pulmonary:     Effort: Pulmonary effort is normal. No respiratory distress.     Breath sounds: Normal breath sounds.  Abdominal:     General: Abdomen is flat.     Palpations: Abdomen is soft.  Genitourinary:    Comments: External: Normal  appearing vulva. No lesions noted.  Speculum examination: Normal appearing cervix. No blood in the vaginal vault. no discharge.  Blue IUD strings seen Bimanual examination: Uterus midline, non-tender, normal in size, shape and contour.  No CMT. No adnexal masses. No adnexal tenderness. Pelvis not fixed.  Breast exam: exam not performed Musculoskeletal:        General: No signs of injury.  Skin:    General: Skin is warm and dry.  Neurological:     Mental Status: She is alert and oriented to person, place, and time.  Psychiatric:        Behavior: Behavior normal.        Thought Content: Thought content normal.        Judgment: Judgment normal.     Assessment/Plan:     45 yo 1. [name] in place- IUD strings seen 2. History NIL HPV positive pap - Pap collected today.  More than 20 minutes were spent face to face with the patient in the room, reviewing the medical record, labs and images, and coordinating care for the patient. The plan of management was discussed in detail and counseling was provided.     [name] MD [facility], [facility] 04/12/2021 3:46 PM

## 2021-04-17 LAB — CYTOLOGY - PAP
Comment: NEGATIVE
Diagnosis: NEGATIVE
Diagnosis: REACTIVE
High risk HPV: NEGATIVE

## 2021-06-04 ENCOUNTER — Encounter: Payer: Self-pay | Admitting: Family Medicine

## 2021-06-04 ENCOUNTER — Ambulatory Visit (INDEPENDENT_AMBULATORY_CARE_PROVIDER_SITE_OTHER): Payer: Managed Care, Other (non HMO) | Admitting: Family Medicine

## 2021-06-04 VITALS — Ht 61.5 in | Wt 135.0 lb

## 2021-06-04 DIAGNOSIS — R058 Other specified cough: Secondary | ICD-10-CM

## 2021-06-04 MED ORDER — ALBUTEROL SULFATE HFA 108 (90 BASE) MCG/ACT IN AERS: 2.0000 | INHALATION_SPRAY | Freq: Four times a day (QID) | RESPIRATORY_TRACT | 0 refills | Status: DC | PRN

## 2021-06-04 MED ORDER — BENZONATATE 100 MG PO CAPS: 100.0000 mg | ORAL_CAPSULE | Freq: Two times a day (BID) | ORAL | 0 refills | Status: DC | PRN

## 2021-06-04 NOTE — Progress Notes (Signed)
MyChart Video Visit    Virtual Visit via Video Note   This visit type was conducted due to national recommendations for restrictions regarding the COVID-19 Pandemic (e.g. social distancing) in an effort to limit this patient's exposure and mitigate transmission in our community. This patient is at least at moderate risk for complications without adequate follow up. This format is felt to be most appropriate for this patient at this time. Physical exam was limited by quality of the video and audio technology used for the visit.    Patient location: home Provider location: [facility] Persons involved in the visit: patient, provider  I discussed the limitations of evaluation and management by telemedicine and the availability of in person appointments. The patient expressed understanding and agreed to proceed.  Patient: Carolyn Haynes   DOB: July 12, 1976   45 y.o. Female  MRN: [id number] Visit Date: 06/04/2021  Today's healthcare provider: Shirlee Latch, MD   No chief complaint on file.  Subjective    Cough This is a recurrent problem. The current episode started more than 1 month ago. The problem has been unchanged. The problem occurs constantly. The cough is Non-productive. Pertinent negatives include no chills, ear congestion, ear pain, fever, postnasal drip, rhinorrhea, sore throat, shortness of breath or wheezing. Exacerbated by: heat. She has tried nothing for the symptoms. The treatment provided no relief. Her past medical history is significant for asthma.   Often has lingering cough after having a virus in the past. Non-productive. No SOB, fever.  Has not tried albuterol inhaler. Took delsym and it only helped some and gave side effects.    Medications: Outpatient Medications Prior to Visit  Medication Sig   albuterol (VENTOLIN HFA) 108 (90 Base) MCG/ACT inhaler Inhale 2 puffs into the lungs every 6 (six) hours as needed for wheezing or shortness of  breath.   cholecalciferol (VITAMIN D) 1000 units tablet Take 1,000 Units by mouth daily.   levothyroxine (SYNTHROID) 50 MCG tablet Take 1 tablet (50 mcg total) by mouth daily.   OXcarbazepine (TRILEPTAL) 150 MG tablet 1 tablet   Specialty Vitamins Products (MAGNESIUM, AMINO ACID CHELATE,) 133 MG tablet Take 1 tablet by mouth 2 (two) times daily.   vitamin B-12 (CYANOCOBALAMIN) 1000 MCG tablet Take by mouth.   Facility-Administered Medications Prior to Visit  Medication Dose Route Frequency Provider   Levonorgestrel IUD 19.5 mg  1 each Intrauterine Once Schuman, Christanna R, MD    Review of Systems  Constitutional:  Negative for chills and fever.  HENT:  Negative for ear pain, postnasal drip, rhinorrhea and sore throat.   Respiratory:  Positive for cough. Negative for shortness of breath and wheezing.      Objective    There were no vitals taken for this visit.   Physical Exam Constitutional:      General: She is not in acute distress.    Appearance: Normal appearance.  HENT:     Head: Normocephalic.  Pulmonary:     Effort: Pulmonary effort is normal. No respiratory distress.  Neurological:     Mental Status: She is alert and oriented to person, place, and time. Mental status is at baseline.       Assessment & Plan     1. Post-viral cough syndrome - recurrent problem after viral URIs in the past with this episode x6 weeks since having COVID - discussed natural course and use of honey - can use tessalon prn - seems to be a RAD component, so try  albuterol MDI - consider prednisone taper  Meds ordered this encounter  Medications   benzonatate (TESSALON) 100 MG capsule    Sig: Take 1 capsule (100 mg total) by mouth 2 (two) times daily as needed for cough.    Dispense:  20 capsule    Refill:  0   albuterol (VENTOLIN HFA) 108 (90 Base) MCG/ACT inhaler    Sig: Inhale 2 puffs into the lungs every 6 (six) hours as needed for wheezing or shortness of breath.    Dispense:   8 g    Refill:  0     No follow-ups on file.     I discussed the assessment and treatment plan with the patient. The patient was provided an opportunity to ask questions and all were answered. The patient agreed with the plan and demonstrated an understanding of the instructions.   The patient was advised to call back or seek an in-person evaluation if the symptoms worsen or if the condition fails to improve as anticipated.  I, Shirlee Latch, MD, have reviewed all documentation for this visit. The documentation on 06/04/21 for the exam, diagnosis, procedures, and orders are all accurate and complete.   Shanikqua Zarzycki, Marzella Schlein, MD, MPH [facility]

## 2021-06-20 ENCOUNTER — Telehealth: Payer: Self-pay

## 2021-06-20 DIAGNOSIS — R058 Other specified cough: Secondary | ICD-10-CM

## 2021-06-20 NOTE — Telephone Encounter (Signed)
Patient states that she saw you virtually for a cough on 06/04/2021 and you discussed that if she needed something stronger that you would prescribe a steroid.  She states that she can not really tell a difference since she saw you and would like for you to send in the steroid to [facility] on [address]

## 2021-06-22 MED ORDER — PREDNISONE 10 MG PO TABS: ORAL_TABLET | ORAL | 0 refills | Status: AC

## 2021-06-22 NOTE — Telephone Encounter (Signed)
Okay to send in prednisone taper-60, 50, 40, 30, 20, 10 mg daily

## 2021-06-27 ENCOUNTER — Other Ambulatory Visit: Payer: Self-pay | Admitting: Family Medicine

## 2021-06-27 NOTE — Telephone Encounter (Signed)
Requested medication (s) are due for refill today: no  Requested medication (s) are on the active medication list: yes  Last refill:  06/04/2021  Future visit scheduled: yes  Notes to clinic:  Failed protocol: One inhaler should last at least one month. If the patient is requesting refills earlier, contact the patient to check for uncontrolled symptoms   Requested Prescriptions  Pending Prescriptions Disp Refills   albuterol (VENTOLIN HFA) 108 (90 Base) MCG/ACT inhaler [Pharmacy Med Name: ALBUTEROL HFA (PROAIR) INHALER] 8.5 each     Sig: TAKE 2 PUFFS BY MOUTH EVERY 6 HOURS AS NEEDED FOR WHEEZE OR SHORTNESS OF BREATH      Pulmonology:  Beta Agonists Failed - 06/27/2021  1:27 AM      Failed - One inhaler should last at least one month. If the patient is requesting refills earlier, contact the patient to check for uncontrolled symptoms.      Passed - Valid encounter within last 12 months    Recent Outpatient Visits           3 weeks ago Post-viral cough syndrome   [facility] [place], Marzella Schlein, [place]   9 months ago Annual physical exam   [facility] Trey Sailors, [place]   1 year ago Annual physical exam   [facility] Trey Sailors, [place]   2 years ago Adult hypothyroidism   [facility] [place], Lavella Hammock, [place]   2 years ago Bronchitis   [facility] [place], Lavella Hammock, [place]       Future Appointments             In 2 months Jacky Kindle, FNP [facility]

## 2021-07-19 ENCOUNTER — Other Ambulatory Visit: Payer: Self-pay | Admitting: Family Medicine

## 2021-07-19 DIAGNOSIS — R058 Other specified cough: Secondary | ICD-10-CM

## 2021-07-19 NOTE — Telephone Encounter (Signed)
Requested medications are due for refill today.  unknown  Requested medications are on the active medications list.  no  Last refill. 06/22/2021  Future visit scheduled.   yes  Notes to clinic.  Medication not delegated. PCP listed as Marykay Lex.

## 2021-08-22 ENCOUNTER — Other Ambulatory Visit: Payer: Self-pay | Admitting: Family Medicine

## 2021-08-22 DIAGNOSIS — R058 Other specified cough: Secondary | ICD-10-CM

## 2021-09-05 ENCOUNTER — Ambulatory Visit (INDEPENDENT_AMBULATORY_CARE_PROVIDER_SITE_OTHER): Payer: Managed Care, Other (non HMO) | Admitting: Family Medicine

## 2021-09-05 ENCOUNTER — Encounter: Payer: Self-pay | Admitting: Physician Assistant

## 2021-09-05 ENCOUNTER — Encounter: Payer: Self-pay | Admitting: Family Medicine

## 2021-09-05 ENCOUNTER — Other Ambulatory Visit: Payer: Self-pay

## 2021-09-05 VITALS — BP 121/85 | HR 88 | Temp 98.2°F | Ht 61.5 in | Wt 136.8 lb

## 2021-09-05 DIAGNOSIS — E039 Hypothyroidism, unspecified: Secondary | ICD-10-CM

## 2021-09-05 DIAGNOSIS — Z0000 Encounter for general adult medical examination without abnormal findings: Secondary | ICD-10-CM

## 2021-09-05 DIAGNOSIS — R053 Chronic cough: Secondary | ICD-10-CM | POA: Diagnosis not present

## 2021-09-05 DIAGNOSIS — J301 Allergic rhinitis due to pollen: Secondary | ICD-10-CM

## 2021-09-05 DIAGNOSIS — E282 Polycystic ovarian syndrome: Secondary | ICD-10-CM

## 2021-09-05 DIAGNOSIS — U099 Post covid-19 condition, unspecified: Secondary | ICD-10-CM

## 2021-09-05 MED ORDER — GUAIFENESIN-CODEINE 100-10 MG/5ML PO SYRP: 10.0000 mL | ORAL_SOLUTION | Freq: Every evening | ORAL | 0 refills | Status: DC | PRN

## 2021-09-05 MED ORDER — LEVOTHYROXINE SODIUM 50 MCG PO TABS: 50.0000 ug | ORAL_TABLET | Freq: Every day | ORAL | 3 refills | Status: DC

## 2021-09-05 NOTE — Assessment & Plan Note (Signed)
Denies symptoms Occ cycle d/t IUD in place

## 2021-09-05 NOTE — Assessment & Plan Note (Signed)
Taking OTC Allergra Advised adding flonase if needed

## 2021-09-05 NOTE — Assessment & Plan Note (Signed)
Some hair loss r/t COVID; denies hot/cold intolerance Rx refilled

## 2021-09-05 NOTE — Progress Notes (Signed)
Complete physical exam   Patient: Carolyn Haynes   DOB: 12-14-1976   45 y.o. Female  MRN: [id number] Visit Date: 09/05/2021  Today's healthcare provider: Jacky Kindle, FNP   Chief Complaint  Patient presents with   Annual Exam   Subjective    Carolyn Haynes is a 45 y.o. female who presents today for a complete physical exam.  She reports consuming a general and more protein less carb  diet. The patient does not participate in regular exercise at present. She generally feels well. She reports sleeping well. She does not have additional problems to discuss today.   Husband is pre-diabetic; patient cooks to help prevent progression of diabetes.  HPI  Would like to schedule visit for tdap and not interested in flu vaccine  Past Medical History:  Diagnosis Date   Allergic rhinitis    Asthma    Esophagitis, reflux    Hypothyroid    IBS (irritable bowel syndrome)    Polycystic ovaries    Past Surgical History:  Procedure Laterality Date   BRAIN SURGERY Right 10/13/2017   trigenial nerve    DILATION AND CURETTAGE OF UTERUS  2000   NASAL SINUS SURGERY  08/2013   Social History   Socioeconomic History   Marital status: Married    Spouse name: Not on file   Number of children: Not on file   Years of education: Not on file   Highest education level: Not on file  Occupational History   Not on file  Tobacco Use   Smoking status: Never   Smokeless tobacco: Never  Vaping Use   Vaping Use: Never used  Substance and Sexual Activity   Alcohol use: No    Alcohol/week: 0.0 standard drinks   Drug use: Never   Sexual activity: Yes    Birth control/protection: Pill  Other Topics Concern   Not on file  Social History Narrative   Not on file   Social Determinants of Health   Financial Resource Strain: Not on file  Food Insecurity: Not on file  Transportation Needs: Not on file  Physical Activity: Not on file  Stress: Not on file  Social Connections: Not on file   Intimate Partner Violence: Not on file   Family Status  Relation Name Status   Father  Alive   Mother  Alive   Sister twin sister Alive   MGM  Deceased   Neg Hx  (Not Specified)   Family History  Problem Relation Age of Onset   Hypertension Father    Bladder Cancer Father    Cancer Maternal Grandmother        pancreatic   Allergic rhinitis Neg Hx    Asthma Neg Hx    Eczema Neg Hx    Urticaria Neg Hx    Allergies  Allergen Reactions   Carbamazepine     Other reaction(s): Other (See Comments) Made patient sleepy loopy   Penicillins     Patient Care Team: Jacky Kindle, FNP as PCP - General (Family Medicine)   Medications: Outpatient Medications Prior to Visit  Medication Sig   albuterol (VENTOLIN HFA) 108 (90 Base) MCG/ACT inhaler TAKE 2 PUFFS BY MOUTH EVERY 6 HOURS AS NEEDED FOR WHEEZE OR SHORTNESS OF BREATH   Bioflavonoid Products (VITAMIN C) CHEW See admin instructions.   BIOTIN PO Take 1 tablet by mouth daily.   cholecalciferol (VITAMIN D) 1000 units tablet Take 1,000 Units by mouth daily.   FOLIC ACID PO Take  1 tablet by mouth daily.   OXcarbazepine (TRILEPTAL) 150 MG tablet 1 tablet   Specialty Vitamins Products (MAGNESIUM, AMINO ACID CHELATE,) 133 MG tablet Take 1 tablet by mouth 2 (two) times daily.   vitamin B-12 (CYANOCOBALAMIN) 1000 MCG tablet Take by mouth.   [DISCONTINUED] benzonatate (TESSALON) 100 MG capsule Take 1 capsule (100 mg total) by mouth 2 (two) times daily as needed for cough.   [DISCONTINUED] levothyroxine (SYNTHROID) 50 MCG tablet Take 1 tablet (50 mcg total) by mouth daily.   Facility-Administered Medications Prior to Visit  Medication Dose Route Frequency Provider   Levonorgestrel IUD 19.5 mg  1 each Intrauterine Once Schuman, Jaquelyn Bitter, MD    Review of Systems  All other systems reviewed and are negative.    Objective    BP 121/85 (BP Location: Right Arm, Patient Position: Sitting, Cuff Size: Normal)   Pulse 88   Temp 98.2 F  (36.8 C) (Oral)   Ht 5' 1.5" (1.562 m)   Wt 136 lb 12.8 oz (62.1 kg)   LMP 08/17/2021 Comment: ended on the 9th  SpO2 100%   BMI 25.43 kg/m    Physical Exam Vitals and nursing note reviewed.  Constitutional:      General: She is awake. She is not in acute distress.    Appearance: Normal appearance. She is well-developed, well-groomed and normal weight. She is not ill-appearing, toxic-appearing or diaphoretic.  HENT:     Head: Normocephalic and atraumatic.     Jaw: There is normal jaw occlusion. No trismus, tenderness, swelling or pain on movement.     Right Ear: Hearing, tympanic membrane, ear canal and external ear normal. There is no impacted cerumen.     Left Ear: Hearing, tympanic membrane, ear canal and external ear normal. There is no impacted cerumen.     Nose: Nose normal. No congestion or rhinorrhea.     Right Turbinates: Not enlarged, swollen or pale.     Left Turbinates: Not enlarged, swollen or pale.     Right Sinus: No maxillary sinus tenderness or frontal sinus tenderness.     Left Sinus: No maxillary sinus tenderness or frontal sinus tenderness.     Mouth/Throat:     Lips: Pink.     Mouth: Mucous membranes are moist. No injury.     Tongue: No lesions.     Pharynx: Oropharynx is clear. Uvula midline. No pharyngeal swelling, oropharyngeal exudate, posterior oropharyngeal erythema or uvula swelling.     Tonsils: No tonsillar exudate or tonsillar abscesses.     Comments: Due for dental cleaning; hasn't been seen in >1 year Eyes:     General: Lids are normal. Lids are everted, no foreign bodies appreciated. Vision grossly intact. Gaze aligned appropriately. No allergic shiner or visual field deficit.       Right eye: No discharge.        Left eye: No discharge.     Extraocular Movements: Extraocular movements intact.     Conjunctiva/sclera: Conjunctivae normal.     Right eye: Right conjunctiva is not injected. No exudate.    Left eye: Left conjunctiva is not injected.  No exudate.    Pupils: Pupils are equal, round, and reactive to light.     Comments: Last at eye dr for laser sx in 2017; denies any complaints in vision  Neck:     Thyroid: No thyroid mass, thyromegaly or thyroid tenderness.     Vascular: No carotid bruit.     Trachea: Trachea normal.  Cardiovascular:  Rate and Rhythm: Normal rate and regular rhythm.     Pulses: Normal pulses.          Carotid pulses are 2+ on the right side and 2+ on the left side.      Radial pulses are 2+ on the right side and 2+ on the left side.       Dorsalis pedis pulses are 2+ on the right side and 2+ on the left side.       Posterior tibial pulses are 2+ on the right side and 2+ on the left side.     Heart sounds: Normal heart sounds, S1 normal and S2 normal. No murmur heard.   No friction rub. No gallop.  Pulmonary:     Effort: Pulmonary effort is normal. No respiratory distress.     Breath sounds: Normal breath sounds and air entry. No stridor. No wheezing, rhonchi or rales.  Chest:     Chest wall: No tenderness.     Comments: Breast exam deferred; discussed 'know your lemons' campaign and self exam Abdominal:     General: Abdomen is flat. Bowel sounds are normal. There is no distension.     Palpations: Abdomen is soft. There is no mass.     Tenderness: There is no abdominal tenderness. There is no right CVA tenderness, left CVA tenderness, guarding or rebound.     Hernia: No hernia is present.  Genitourinary:    Comments: Exam deferred; denies complaints Musculoskeletal:        General: No swelling, tenderness, deformity or signs of injury. Normal range of motion.     Cervical back: Full passive range of motion without pain, normal range of motion and neck supple. No edema, rigidity or tenderness. No muscular tenderness.     Right lower leg: No edema.     Left lower leg: No edema.  Lymphadenopathy:     Cervical: No cervical adenopathy.     Right cervical: No superficial, deep or posterior cervical  adenopathy.    Left cervical: No superficial, deep or posterior cervical adenopathy.  Skin:    General: Skin is warm and dry.     Capillary Refill: Capillary refill takes less than 2 seconds.     Coloration: Skin is not jaundiced or pale.     Findings: No bruising, erythema, lesion or rash.  Neurological:     General: No focal deficit present.     Mental Status: She is alert and oriented to person, place, and time. Mental status is at baseline.     GCS: GCS eye subscore is 4. GCS verbal subscore is 5. GCS motor subscore is 6.     Cranial Nerves: No cranial nerve deficit.     Sensory: Sensation is intact. No sensory deficit.     Motor: Motor function is intact. No weakness.     Coordination: Coordination is intact. Coordination normal.     Gait: Gait is intact. Gait normal.  Psychiatric:        Attention and Perception: Attention and perception normal.        Mood and Affect: Mood and affect normal.        Speech: Speech normal.        Behavior: Behavior normal. Behavior is cooperative.        Thought Content: Thought content normal.        Cognition and Memory: Cognition and memory normal.        Judgment: Judgment normal.     Last depression  screening scores PHQ 2/9 Scores 09/05/2021 09/04/2020 09/02/2019  PHQ - 2 Score 0 0 0  PHQ- 9 Score 0 0 0   Last fall risk screening Fall Risk  09/05/2021  Falls in the past year? 0  Number falls in past yr: 0  Injury with Fall? 0  Risk for fall due to : No Fall Risks  Follow up -   Last Audit-C alcohol use screening Alcohol Use Disorder Test (AUDIT) 09/05/2021  1. How often do you have a drink containing alcohol? 1  2. How many drinks containing alcohol do you have on a typical day when you are drinking? 0  3. How often do you have six or more drinks on one occasion? 0  AUDIT-C Score 1   A score of 3 or more in women, and 4 or more in men indicates increased risk for alcohol abuse, EXCEPT if all of the points are from question 1   No  results found for any visits on 09/05/21.  Assessment & Plan    Routine Health Maintenance and Physical Exam  Exercise Activities and Dietary recommendations  Goals   None     Immunization History  Administered Date(s) Administered   Influenza,inj,Quad PF,6+ Mos 09/02/2019    Health Maintenance  Topic Date Due   TETANUS/TDAP  Never done   INFLUENZA VACCINE  12/16/2021 (Originally 07/16/2021)   COVID-19 Vaccine (1) 05/08/2022 (Originally 03/31/1977)   PAP SMEAR-Modifier  04/12/2026   Hepatitis C Screening  Completed   HIV Screening  Completed   HPV VACCINES  Aged Out    Discussed health benefits of physical activity, and encouraged her to engage in regular exercise appropriate for her age and condition.  Problem List Items Addressed This Visit       Respiratory   Allergic rhinitis    Taking OTC Allergra Advised adding flonase if needed        Endocrine   Adult hypothyroidism    Some hair loss r/t COVID; denies hot/cold intolerance Rx refilled      Relevant Medications   levothyroxine (SYNTHROID) 50 MCG tablet   Bilateral polycystic ovarian syndrome    Denies symptoms Occ cycle d/t IUD in place        Other   Post-COVID chronic cough - Primary    Cough > 6 months; continues to use inhaler Advised water and hard candies to prevent dry throat Trial of Rx cough syrup for night time      Relevant Medications   guaiFENesin-codeine (ROBITUSSIN AC) 100-10 MG/5ML syrup   Other Visit Diagnoses     Annual physical exam       Relevant Orders   CBC with Differential/Platelet   Comprehensive metabolic panel   Hemoglobin A1c   TSH   T4, free   Magnesium   Lipid panel   10-Hydroxycarbazepine       Return in about 1 year (around 09/05/2022) for annual examination.     Leilani Merl, FNP, have reviewed all documentation for this visit. The documentation on 09/05/21 for the exam, diagnosis, procedures, and orders are all accurate and complete.    Jacky Kindle, FNP  [facility] [phone] (phone) [phone] (fax)  [facility]

## 2021-09-05 NOTE — Assessment & Plan Note (Signed)
Cough > 6 months; continues to use inhaler Advised water and hard candies to prevent dry throat Trial of Rx cough syrup for night time

## 2021-09-06 NOTE — Progress Notes (Signed)
Labs have returned; platelets continue to be low- watch for any signs of bleeding.  Normal chemistry; outside of 1 ratio. Will continue to trend.  No pre-diabetes.  Normal thyroid.  Normal magnesium.  Cholesterol panel shows elevated bad/LDL; and low good/HDL. recommend diet low in saturated fat and regular exercise - 30 min at least 5 times per week  Awaiting return of oxcarbazepine level for Sherryll Burger.  Thanks EP

## 2021-09-07 LAB — COMPREHENSIVE METABOLIC PANEL
ALT: 26 IU/L (ref 0–32)
AST: 22 IU/L (ref 0–40)
Albumin/Globulin Ratio: 2.1 (ref 1.2–2.2)
Albumin: 4.7 g/dL (ref 3.8–4.8)
Alkaline Phosphatase: 68 IU/L (ref 44–121)
BUN/Creatinine Ratio: 34 — ABNORMAL HIGH (ref 9–23)
BUN: 21 mg/dL (ref 6–24)
Bilirubin Total: 0.5 mg/dL (ref 0.0–1.2)
CO2: 20 mmol/L (ref 20–29)
Calcium: 9.4 mg/dL (ref 8.7–10.2)
Chloride: 104 mmol/L (ref 96–106)
Creatinine, Ser: 0.62 mg/dL (ref 0.57–1.00)
Globulin, Total: 2.2 g/dL (ref 1.5–4.5)
Glucose: 77 mg/dL (ref 65–99)
Potassium: 4 mmol/L (ref 3.5–5.2)
Sodium: 140 mmol/L (ref 134–144)
Total Protein: 6.9 g/dL (ref 6.0–8.5)
eGFR: 113 mL/min/{1.73_m2} (ref >59)

## 2021-09-07 LAB — LIPID PANEL
Chol/HDL Ratio: 5.2 ratio — ABNORMAL HIGH (ref 0.0–4.4)
Cholesterol, Total: 187 mg/dL (ref 100–199)
HDL: 36 mg/dL — ABNORMAL LOW (ref >39)
LDL Chol Calc (NIH): 134 mg/dL — ABNORMAL HIGH (ref 0–99)
Triglycerides: 95 mg/dL (ref 0–149)
VLDL Cholesterol Cal: 17 mg/dL (ref 5–40)

## 2021-09-07 LAB — CBC WITH DIFFERENTIAL/PLATELET
Basophils Absolute: 0 10*3/uL (ref 0.0–0.2)
Basos: 0 %
EOS (ABSOLUTE): 0 10*3/uL (ref 0.0–0.4)
Eos: 0 %
Hematocrit: 43.2 % (ref 34.0–46.6)
Hemoglobin: 14.5 g/dL (ref 11.1–15.9)
Immature Grans (Abs): 0 10*3/uL (ref 0.0–0.1)
Immature Granulocytes: 1 %
Lymphocytes Absolute: 1.9 10*3/uL (ref 0.7–3.1)
Lymphs: 23 %
MCH: 29.9 pg (ref 26.6–33.0)
MCHC: 33.6 g/dL (ref 31.5–35.7)
MCV: 89 fL (ref 79–97)
Monocytes Absolute: 0.6 10*3/uL (ref 0.1–0.9)
Monocytes: 7 %
Neutrophils Absolute: 5.9 10*3/uL (ref 1.4–7.0)
Neutrophils: 69 %
Platelets: 132 10*3/uL — ABNORMAL LOW (ref 150–450)
RBC: 4.85 x10E6/uL (ref 3.77–5.28)
RDW: 12.4 % (ref 11.7–15.4)
WBC: 8.5 10*3/uL (ref 3.4–10.8)

## 2021-09-07 LAB — HEMOGLOBIN A1C
Est. average glucose Bld gHb Est-mCnc: 111 mg/dL
Hgb A1c MFr Bld: 5.5 % (ref 4.8–5.6)

## 2021-09-07 LAB — MAGNESIUM: Magnesium: 1.9 mg/dL (ref 1.6–2.3)

## 2021-09-07 LAB — 10-HYDROXYCARBAZEPINE: Oxcarbazepine SerPl-Mcnc: 1 ug/mL — ABNORMAL LOW (ref 10–35)

## 2021-09-07 LAB — TSH: TSH: 1.05 u[IU]/mL (ref 0.450–4.500)

## 2021-09-07 LAB — T4, FREE: Free T4: 1.51 ng/dL (ref 0.82–1.77)

## 2022-02-20 IMAGING — US US THYROID
1 series · 14 of 25 positions shown · non-contrast
Comparison: None available

CLINICAL DATA: Nodule on physical exam

EXAM:
THYROID ULTRASOUND
TECHNIQUE: Ultrasound examination of the thyroid gland and adjacent soft
tissues was performed.

[Series 1: us thyroid · 0.05mm/px · 14 of 30 slices shown]
[im 1/30]
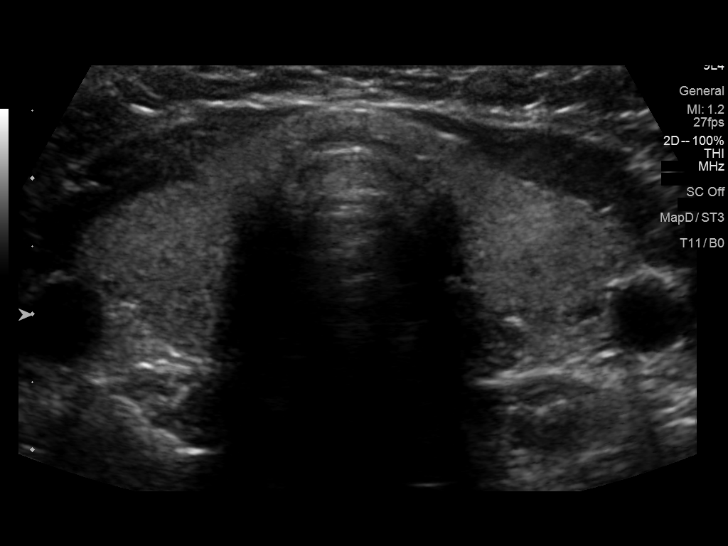
[im 3/30]
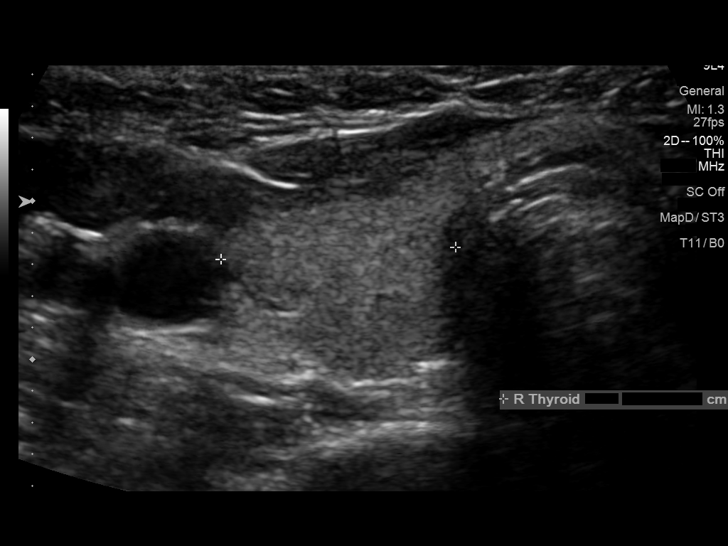
[im 5/30]
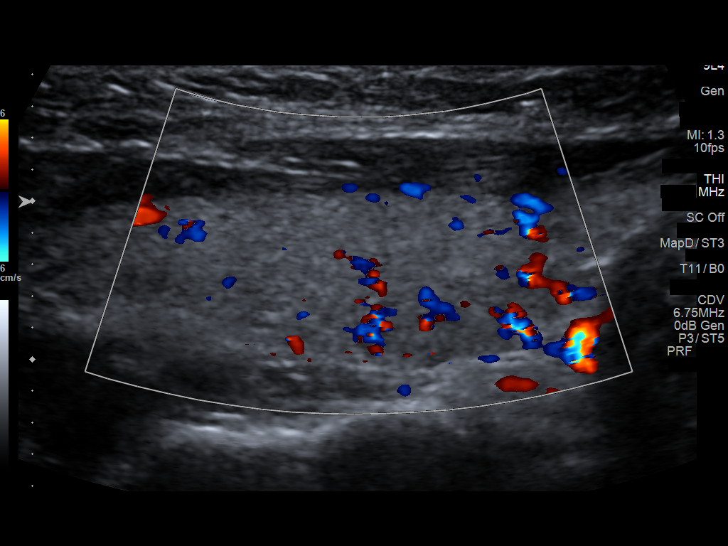
[im 8/30]
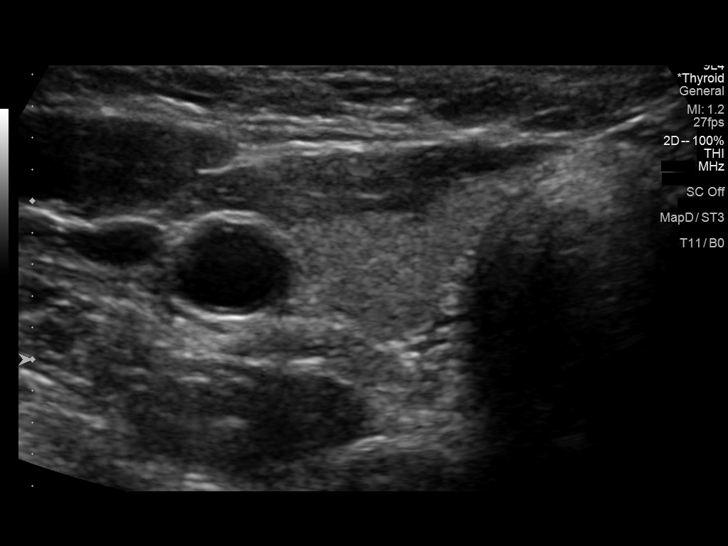
[im 10/30]
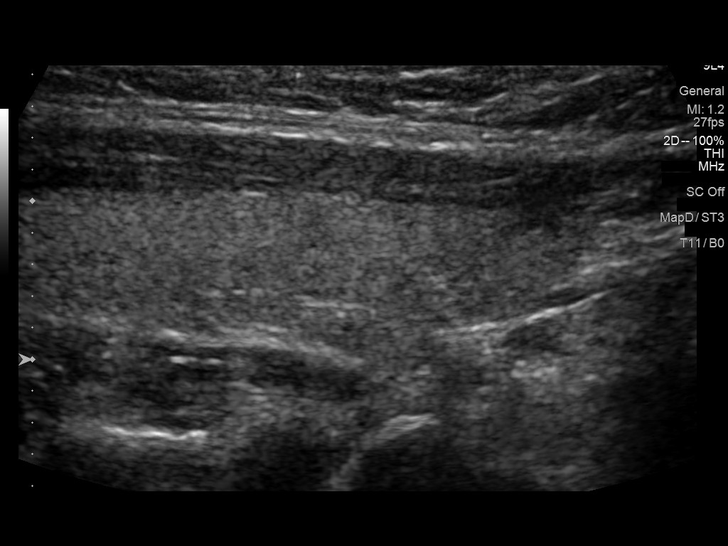
[im 11/30]
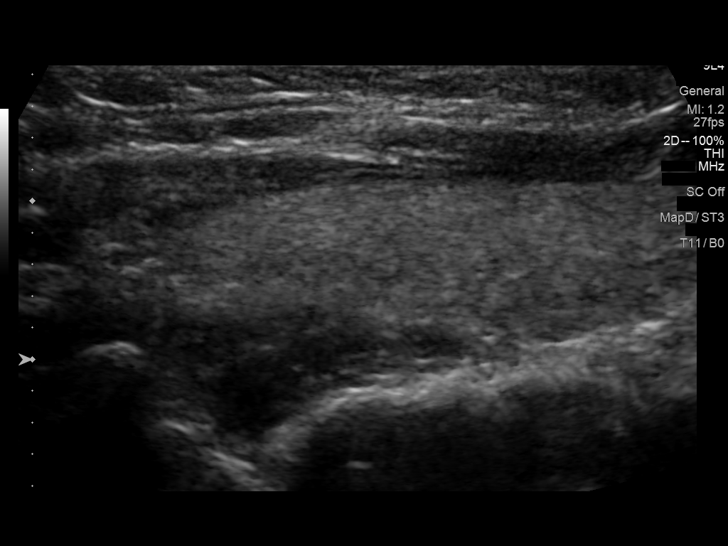
[im 14/30]
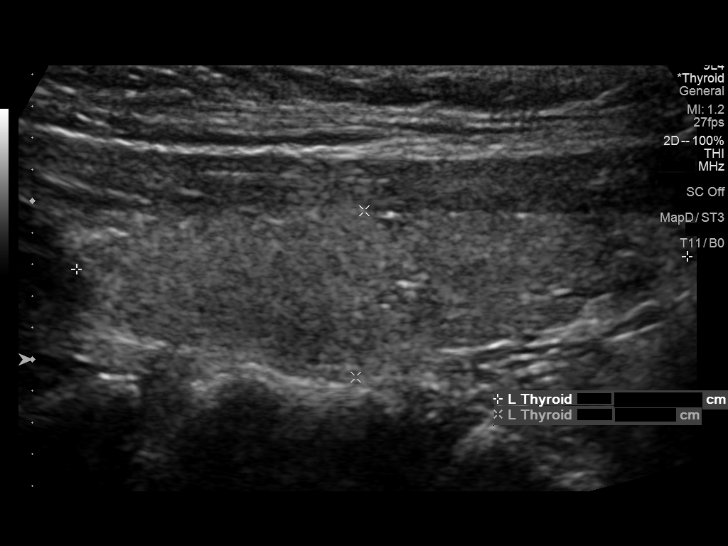
[im 16/30]
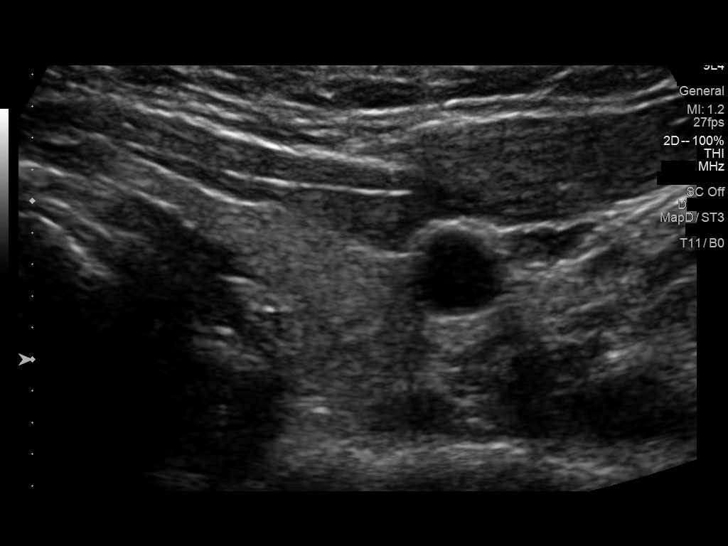
[im 19/30]
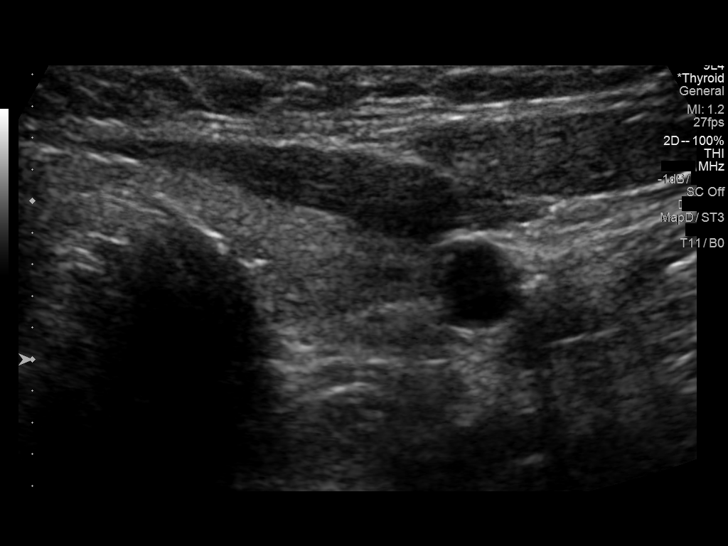
[im 20/30]
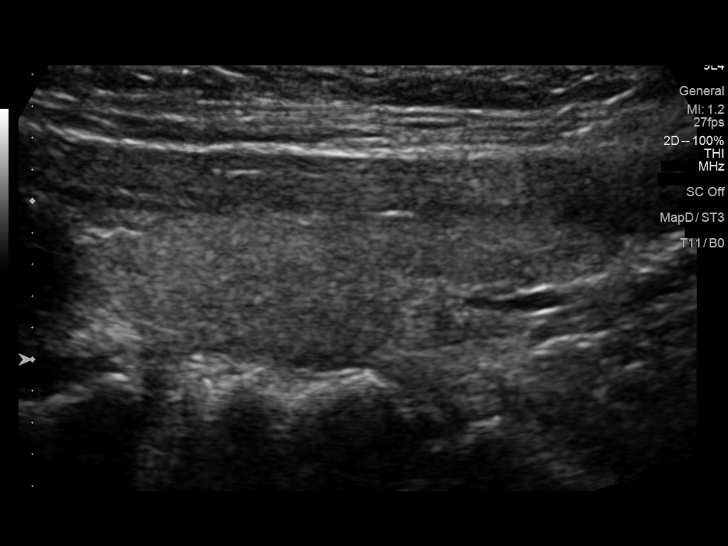
[im 22/30]
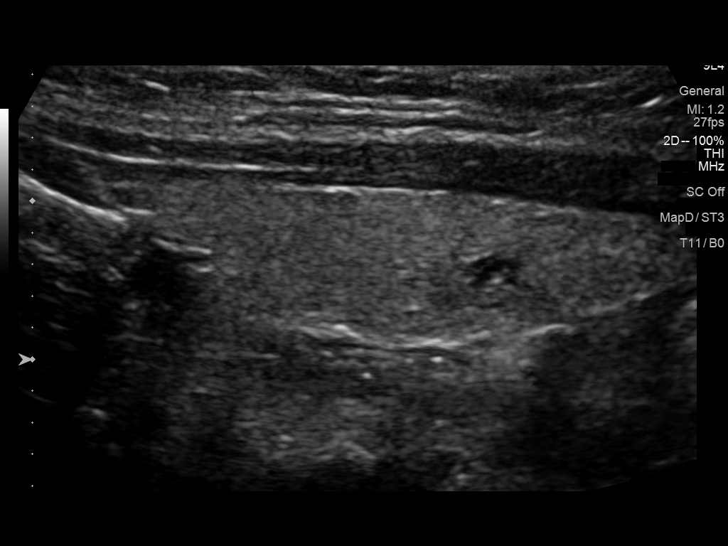
[im 25/30]
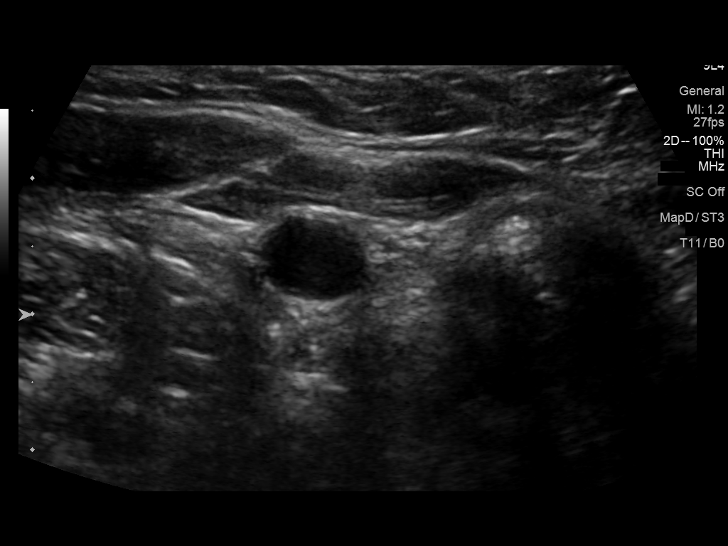
[im 27/30]
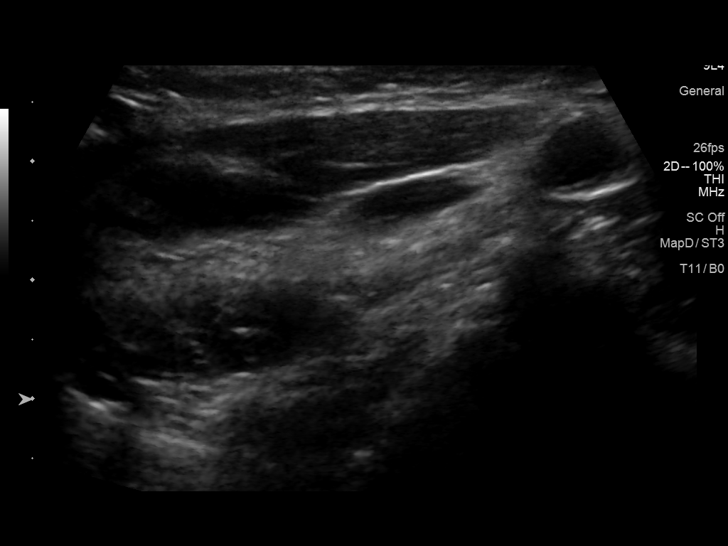
[im 30/30]
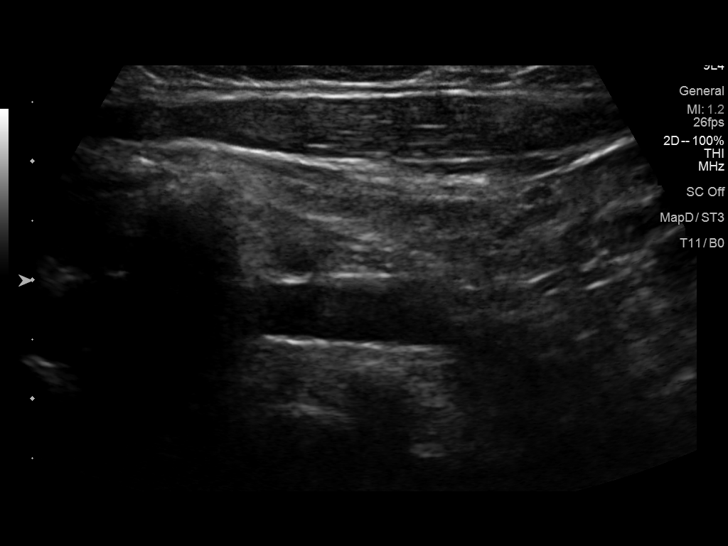

[14 of 25 positions shown; findings below may reference images not displayed]

FINDINGS: Parenchymal Echotexture: Normal

Isthmus: 0.3 cm thickness

Right lobe: 4.1 x 0.9 x 1.5 cm

Left lobe: 3.9 x 1.1 x 1.4 cm

_________________________________________________________

Estimated total number of nodules >/= 1 cm: 0

Number of spongiform nodules >/=  2 cm not described below (TR1): 0

Number of mixed cystic and solid nodules >/= 1.5 cm not described
below (TR2): 0

_________________________________________________________

No discrete nodules are seen within the thyroid gland. No regional
adenopathy identified.
IMPRESSION: Normal thyroid

The above is in keeping with the ACR TI-RADS recommendations - [HOSPITAL] 5143;[DATE].

## 2022-08-05 NOTE — Progress Notes (Unsigned)
Telephone Visit    Virtual Visit via Telephone Note   This visit type was conducted due to national recommendations for restrictions regarding the COVID-19 Pandemic (e.g. social distancing) in an effort to limit this patient's exposure and mitigate transmission in our community. This patient is at least at moderate risk for complications without adequate follow up. This format is felt to be most appropriate for this patient at this time. Physical exam was limited by quality of the video and audio technology used for the visit.   Patient location: work Restaurant manager, fast food location: [facility] [address]  [address] [place], [place] [address]   I discussed the limitations of evaluation and management by telemedicine and the availability of in person appointments. The patient expressed understanding and agreed to proceed.  Patient: Carolyn Haynes   DOB: 1976/09/19   46 y.o. Female  MRN: [id number] Visit Date: 08/07/2022  Today's healthcare provider: Jacky Kindle, FNP  RE Introduced to nurse practitioner role and practice setting.  All questions answered.  Discussed provider/patient relationship and expectations.   I,Dream Nodal J Pharrah Rottman,acting as a scribe for Jacky Kindle, FNP.,have documented all relevant documentation on the behalf of Jacky Kindle, FNP,as directed by  Jacky Kindle, FNP while in the presence of Jacky Kindle, FNP.   Chief Complaint  Patient presents with   Rash    Patient complains of a rash on her face since November. Was prescribed doxy, it helped but started again with rash. Was given a cream, it made it worse.    Subjective    HPI HPI     Rash    Additional comments: Patient complains of a rash on her face since November. Was prescribed doxy, it helped but started again with rash. Was given a cream, it made it worse.       Last edited by Marlana Salvage, CMA on 08/07/2022  1:15 PM.      Medications: Outpatient Medications Prior to Visit   Medication Sig   albuterol (VENTOLIN HFA) 108 (90 Base) MCG/ACT inhaler TAKE 2 PUFFS BY MOUTH EVERY 6 HOURS AS NEEDED FOR WHEEZE OR SHORTNESS OF BREATH   Bioflavonoid Products (VITAMIN C) CHEW See admin instructions.   BIOTIN PO Take 1 tablet by mouth daily.   cholecalciferol (VITAMIN D) 1000 units tablet Take 1,000 Units by mouth daily.   FOLIC ACID PO Take 1 tablet by mouth daily.   guaiFENesin-codeine (ROBITUSSIN AC) 100-10 MG/5ML syrup Take 10 mLs by mouth at bedtime as needed for cough.   levothyroxine (SYNTHROID) 50 MCG tablet Take 1 tablet (50 mcg total) by mouth daily.   OXcarbazepine (TRILEPTAL) 150 MG tablet 1 tablet   Specialty Vitamins Products (MAGNESIUM, AMINO ACID CHELATE,) 133 MG tablet Take 1 tablet by mouth 2 (two) times daily.   vitamin B-12 (CYANOCOBALAMIN) 1000 MCG tablet Take by mouth.   Facility-Administered Medications Prior to Visit  Medication Dose Route Frequency Provider   Levonorgestrel IUD 19.5 mg  1 each Intrauterine Once Schuman, Christanna R, MD    Review of Systems    Objective    Ht 5\' 1"  (1.549 m)   Wt 140 lb (63.5 kg)   BMI 26.45 kg/m    Physical Exam     Assessment & Plan     Problem List Items Addressed This Visit       Musculoskeletal and Integument   Rash and nonspecific skin eruption    Acute on chronic, exacerbated; on face 11/22-current; worse  since Easter 2023 No visualization of rash today d/t phone call  Recommend low dose steroid with follow up with derm        Relevant Medications   triamcinolone cream (KENALOG) 0.1 %   Other Relevant Orders   Ambulatory referral to Dermatology     Other   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     I discussed the assessment and treatment plan with the patient. The patient was provided an opportunity to ask questions and all were answered. The patient agreed with the plan and demonstrated an understanding of the instructions.   The patient was advised to call back or seek an in-person evaluation if the symptoms worsen or if the condition fails to improve as anticipated.  I provided 10 minutes of non-face-to-face time during this encounter.  Leilani Merl, FNP, have reviewed all documentation for this visit. The documentation on 08/07/22 for the exam, diagnosis, procedures, and orders are all accurate and complete.   Jacky Kindle, FNP [facility] [phone] (phone) [phone] (fax)  [facility]

## 2022-08-07 ENCOUNTER — Encounter: Payer: Self-pay | Admitting: Family Medicine

## 2022-08-07 ENCOUNTER — Telehealth (INDEPENDENT_AMBULATORY_CARE_PROVIDER_SITE_OTHER): Payer: Managed Care, Other (non HMO) | Admitting: Family Medicine

## 2022-08-07 VITALS — Ht 61.0 in | Wt 140.0 lb

## 2022-08-07 DIAGNOSIS — L539 Erythematous condition, unspecified: Secondary | ICD-10-CM

## 2022-08-07 DIAGNOSIS — R21 Rash and other nonspecific skin eruption: Secondary | ICD-10-CM

## 2022-08-07 MED ORDER — TRIAMCINOLONE ACETONIDE 0.1 % EX CREA: 1.0000 | TOPICAL_CREAM | Freq: Two times a day (BID) | CUTANEOUS | 0 refills | Status: DC

## 2022-08-07 NOTE — Assessment & Plan Note (Signed)
Acute on chronic, exacerbated 11/22-current; worse since Easter 2023 No visualization of rash today d/t phone call  Recommend low dose steroid with follow up with derm

## 2022-08-07 NOTE — Assessment & Plan Note (Signed)
Acute on chronic, exacerbated; on face 11/22-current; worse since Easter 2023 No visualization of rash today d/t phone call  Recommend low dose steroid with follow up with derm

## 2022-08-23 ENCOUNTER — Encounter: Payer: Self-pay | Admitting: Family Medicine

## 2022-09-17 NOTE — Progress Notes (Signed)
Complete physical exam   Patient: Carolyn Haynes   DOB: July 21, 1976   46 y.o. Female  MRN: [id number] Visit Date: 09/20/2022  Today's healthcare provider: Gwyneth Sprout, FNP  Re Introduced to nurse practitioner role and practice setting.  All questions answered.  Discussed provider/patient relationship and expectations.  I,Tiffany J Bragg,acting as a scribe for Gwyneth Sprout, FNP.,have documented all relevant documentation on the behalf of Gwyneth Sprout, FNP,as directed by  Gwyneth Sprout, FNP while in the presence of Gwyneth Sprout, FNP.  Chief Complaint  Patient presents with   Annual Exam   Subjective    Carolyn Haynes is a 46 y.o. female who presents today for a complete physical exam.  She reports consuming a general diet. The patient does not participate in regular exercise at present. She generally feels well. She reports sleeping well. She does not have additional problems to discuss today.   HPI   Past Medical History:  Diagnosis Date   Allergic rhinitis    Asthma    Esophagitis, reflux    Hypothyroid    IBS (irritable bowel syndrome)    Polycystic ovaries    Past Surgical History:  Procedure Laterality Date   BRAIN SURGERY Right 10/13/2017   trigenial nerve    DILATION AND CURETTAGE OF UTERUS  2000   NASAL SINUS SURGERY  08/2013   Social History   Socioeconomic History   Marital status: Married    Spouse name: Not on file   Number of children: Not on file   Years of education: Not on file   Highest education level: Not on file  Occupational History   Not on file  Tobacco Use   Smoking status: Never   Smokeless tobacco: Never  Vaping Use   Vaping Use: Never used  Substance and Sexual Activity   Alcohol use: No    Alcohol/week: 0.0 standard drinks of alcohol   Drug use: Never   Sexual activity: Yes    Birth control/protection: Pill  Other Topics Concern   Not on file  Social History Narrative   Not on file   Social Determinants of Health    Financial Resource Strain: Not on file  Food Insecurity: Not on file  Transportation Needs: Not on file  Physical Activity: Not on file  Stress: Not on file  Social Connections: Not on file  Intimate Partner Violence: Not on file   Family Status  Relation Name Status   Father  Alive   Mother  Alive   Sister twin sister Alive   MGM  Deceased   Neg Hx  (Not Specified)   Family History  Problem Relation Age of Onset   Hypertension Father    Bladder Cancer Father    Cancer Maternal Grandmother        pancreatic   Allergic rhinitis Neg Hx    Asthma Neg Hx    Eczema Neg Hx    Urticaria Neg Hx    Allergies  Allergen Reactions   Carbamazepine     Other reaction(s): Other (See Comments) Made patient sleepy loopy   Penicillins     Patient Care Team: Gwyneth Sprout, FNP as PCP - General (Family Medicine)   Medications: Outpatient Medications Prior to Visit  Medication Sig   albuterol (VENTOLIN HFA) 108 (90 Base) MCG/ACT inhaler TAKE 2 PUFFS BY MOUTH EVERY 6 HOURS AS NEEDED FOR WHEEZE OR SHORTNESS OF BREATH   Bioflavonoid Products (VITAMIN C) CHEW See admin instructions.  BIOTIN PO Take 1 tablet by mouth daily.   cholecalciferol (VITAMIN D) 1000 units tablet Take 1,000 Units by mouth daily.   FOLIC ACID PO Take 1 tablet by mouth daily.   guaiFENesin-codeine (ROBITUSSIN AC) 100-10 MG/5ML syrup Take 10 mLs by mouth at bedtime as needed for cough.   levothyroxine (SYNTHROID) 50 MCG tablet Take 1 tablet (50 mcg total) by mouth daily.   OXcarbazepine (TRILEPTAL) 150 MG tablet 1 tablet   Specialty Vitamins Products (MAGNESIUM, AMINO ACID CHELATE,) 133 MG tablet Take 1 tablet by mouth 2 (two) times daily.   vitamin B-12 (CYANOCOBALAMIN) 1000 MCG tablet Take by mouth.   [DISCONTINUED] triamcinolone cream (KENALOG) 0.1 % Apply 1 Application topically 2 (two) times daily. Use sparingly twice daily for max of 10 days. Encourage sun protection and SPF with use.    Facility-Administered Medications Prior to Visit  Medication Dose Route Frequency Provider   Levonorgestrel IUD 19.5 mg  1 each Intrauterine Once Schuman, Christanna R, MD    Review of Systems   Objective    BP 121/81 (BP Location: Right Arm, Patient Position: Sitting, Cuff Size: Normal)   Pulse 89   Temp 98.2 F (36.8 C) (Oral)   Resp 16   Ht 5\' 1"  (1.549 m)   Wt 137 lb (62.1 kg)   SpO2 100%   BMI 25.89 kg/m   Physical Exam Vitals and nursing note reviewed.  Constitutional:      General: She is awake. She is not in acute distress.    Appearance: Normal appearance. She is well-developed, well-groomed and overweight. She is not ill-appearing, toxic-appearing or diaphoretic.  HENT:     Head: Normocephalic and atraumatic.     Jaw: There is normal jaw occlusion. No trismus, tenderness, swelling or pain on movement.     Right Ear: Hearing, tympanic membrane, ear canal and external ear normal. There is no impacted cerumen.     Left Ear: Hearing, tympanic membrane, ear canal and external ear normal. There is no impacted cerumen.     Nose: Nose normal. No congestion or rhinorrhea.     Right Turbinates: Not enlarged, swollen or pale.     Left Turbinates: Not enlarged, swollen or pale.     Right Sinus: No maxillary sinus tenderness or frontal sinus tenderness.     Left Sinus: No maxillary sinus tenderness or frontal sinus tenderness.     Mouth/Throat:     Lips: Pink.     Mouth: Mucous membranes are moist. No injury.     Tongue: No lesions.     Pharynx: Oropharynx is clear. Uvula midline. No pharyngeal swelling, oropharyngeal exudate, posterior oropharyngeal erythema or uvula swelling.     Tonsils: No tonsillar exudate or tonsillar abscesses.  Eyes:     General: Lids are normal. Lids are everted, no foreign bodies appreciated. Vision grossly intact. Gaze aligned appropriately. No allergic shiner or visual field deficit.       Right eye: No discharge.        Left eye: No  discharge.     Extraocular Movements: Extraocular movements intact.     Conjunctiva/sclera: Conjunctivae normal.     Right eye: Right conjunctiva is not injected. No exudate.    Left eye: Left conjunctiva is not injected. No exudate.    Pupils: Pupils are equal, round, and reactive to light.  Neck:     Thyroid: No thyroid mass, thyromegaly or thyroid tenderness.     Vascular: No carotid bruit.     Trachea:  Trachea normal.  Cardiovascular:     Rate and Rhythm: Normal rate and regular rhythm.     Pulses: Normal pulses.          Carotid pulses are 2+ on the right side and 2+ on the left side.      Radial pulses are 2+ on the right side and 2+ on the left side.       Dorsalis pedis pulses are 2+ on the right side and 2+ on the left side.       Posterior tibial pulses are 2+ on the right side and 2+ on the left side.     Heart sounds: Normal heart sounds, S1 normal and S2 normal. No murmur heard.    No friction rub. No gallop.  Pulmonary:     Effort: Pulmonary effort is normal. No respiratory distress.     Breath sounds: Normal breath sounds and air entry. No stridor. No wheezing, rhonchi or rales.  Chest:     Chest wall: No tenderness.     Comments: Breasts: risk and benefit of breast self-exam was discussed, not examined  Abdominal:     General: Abdomen is flat. Bowel sounds are normal. There is no distension.     Palpations: Abdomen is soft. There is no mass.     Tenderness: There is no abdominal tenderness. There is no right CVA tenderness, left CVA tenderness, guarding or rebound.     Hernia: No hernia is present.  Genitourinary:    Comments: Exam deferred; denies complaints Musculoskeletal:        General: No swelling, tenderness, deformity or signs of injury. Normal range of motion.     Cervical back: Full passive range of motion without pain, normal range of motion and neck supple. No edema, rigidity or tenderness. No muscular tenderness.     Right lower leg: No edema.      Left lower leg: No edema.  Lymphadenopathy:     Cervical: No cervical adenopathy.     Right cervical: No superficial, deep or posterior cervical adenopathy.    Left cervical: No superficial, deep or posterior cervical adenopathy.  Skin:    General: Skin is warm and dry.     Capillary Refill: Capillary refill takes less than 2 seconds.     Coloration: Skin is not jaundiced or pale.     Findings: No bruising, erythema, lesion or rash.  Neurological:     General: No focal deficit present.     Mental Status: She is alert and oriented to person, place, and time. Mental status is at baseline.     GCS: GCS eye subscore is 4. GCS verbal subscore is 5. GCS motor subscore is 6.     Cranial Nerves: No cranial nerve deficit.     Sensory: Sensation is intact. No sensory deficit.     Motor: Motor function is intact. No weakness.     Coordination: Coordination is intact. Coordination normal.     Gait: Gait is intact. Gait normal.  Psychiatric:        Attention and Perception: Attention and perception normal.        Mood and Affect: Mood and affect normal.        Speech: Speech normal.        Behavior: Behavior normal. Behavior is cooperative.        Thought Content: Thought content normal.        Cognition and Memory: Cognition and memory normal.  Judgment: Judgment normal.     Last depression screening scores    09/20/2022    3:11 PM 08/07/2022    1:17 PM 09/05/2021    3:04 PM  PHQ 2/9 Scores  PHQ - 2 Score 0 0 0  PHQ- 9 Score 0 0 0   Last fall risk screening    09/20/2022    3:11 PM  [place] in the past year? 0  Number falls in past yr: 0  Injury with Fall? 0  Risk for fall due to : No Fall Risks  Follow up Falls evaluation completed   Last Audit-C alcohol use screening    09/20/2022    3:11 PM  Alcohol Use Disorder Test (AUDIT)  1. How often do you have a drink containing alcohol? 1  2. How many drinks containing alcohol do you have on a typical day when you are  drinking? 0  3. How often do you have six or more drinks on one occasion? 0  AUDIT-C Score 1   A score of 3 or more in women, and 4 or more in men indicates increased risk for alcohol abuse, EXCEPT if all of the points are from question 1   No results found for any visits on 09/20/22.  Assessment & Plan    Routine Health Maintenance and Physical Exam  Exercise Activities and Dietary recommendations  Goals   None     Immunization History  Administered Date(s) Administered   Influenza,inj,Quad PF,6+ Mos 09/02/2019    Health Maintenance  Topic Date Due   COVID-19 Vaccine (1) 10/06/2022 (Originally 09/30/1982)   INFLUENZA VACCINE  03/16/2023 (Originally 07/16/2022)   TETANUS/TDAP  09/21/2023 (Originally 09/30/1996)   PAP SMEAR-Modifier  04/12/2026   Hepatitis C Screening  Completed   HIV Screening  Completed   HPV VACCINES  Aged Out    Discussed health benefits of physical activity, and encouraged her to engage in regular exercise appropriate for her age and condition.  Problem List Items Addressed This Visit       Musculoskeletal and Integument   Rash and nonspecific skin eruption    Chronic, has not been able to schedule with derm Request for medication refill; discussed risks associated with prolong use including but not limited to skin thinning and risk for sunburns      Relevant Medications   triamcinolone cream (KENALOG) 0.1 %     Other   Annual physical exam - Primary    Needs to schedule dentist; no vision concerns s/p laser eye sx Will send in for mammogram PAP completed 2022 Due for colon cancer screening; wishes to do cologuard once she is 46  Things to do to keep yourself healthy  - Exercise at least 30-45 minutes a day, 3-4 days a week.  - Eat a low-fat diet with lots of fruits and vegetables, up to 7-9 servings per day.  - Seatbelts can save your life. Wear them always.  - Smoke detectors on every level of your home, check batteries every year.  - Eye  Doctor - have an eye exam every 1-2 years  - Safe sex - if you may be exposed to STDs, use a condom.  - Alcohol -  If you drink, do it moderately, less than 2 drinks per day.  - [place]. Choose someone to speak for you if you are not able.  - Depression is common in our stressful world.If you're feeling down or losing interest in  things you normally enjoy, please come in for a visit.  - Violence - If anyone is threatening or hurting you, please call immediately.        Relevant Orders   TSH + free T4   Lipid panel   CBC with Differential/Platelet   Comprehensive Metabolic Panel (CMET)   Hemoglobin A1c   Encounter for screening mammogram for malignant neoplasm of breast    Due for screening for mammogram, denies breast concerns, provided with phone number to call and schedule appointment for mammogram. Encouraged to repeat breast cancer screening every 1-2 years.       Relevant Orders   MM 3D SCREEN BREAST BILATERAL   Facial erythema    Chronic, stable with use of topical mid potency steroid creams; encouraged to call and schedule with derm      Relevant Medications   triamcinolone cream (KENALOG) 0.1 %   Screening for diabetes mellitus    Screening for DM; Continue to recommend balanced, lower carb meals. Smaller meal size, adding snacks. Choosing water as drink of choice and increasing purposeful exercise.       Relevant Orders   Hemoglobin A1c   Screening for lipoid disorders    Screening for lipids; I recommend diet low in saturated fat and regular exercise - 30 min at least 5 times per week       Relevant Orders   Lipid panel   Return in about 1 year (around 09/21/2023) for annual examination.    Vonna Kotyk, FNP, have reviewed all documentation for this visit. The documentation on 09/20/22 for the exam, diagnosis, procedures, and orders are all accurate and complete.  Gwyneth Sprout, [place] [phone]  (phone) [phone] (fax)  [place]

## 2022-09-20 ENCOUNTER — Encounter: Payer: Self-pay | Admitting: Family Medicine

## 2022-09-20 ENCOUNTER — Ambulatory Visit (INDEPENDENT_AMBULATORY_CARE_PROVIDER_SITE_OTHER): Payer: BC Managed Care – PPO | Admitting: Family Medicine

## 2022-09-20 VITALS — BP 121/81 | HR 89 | Temp 98.2°F | Resp 16 | Ht 61.0 in | Wt 137.0 lb

## 2022-09-20 DIAGNOSIS — Z131 Encounter for screening for diabetes mellitus: Secondary | ICD-10-CM

## 2022-09-20 DIAGNOSIS — Z0000 Encounter for general adult medical examination without abnormal findings: Secondary | ICD-10-CM | POA: Diagnosis not present

## 2022-09-20 DIAGNOSIS — L539 Erythematous condition, unspecified: Secondary | ICD-10-CM

## 2022-09-20 DIAGNOSIS — Z13220 Encounter for screening for lipoid disorders: Secondary | ICD-10-CM | POA: Diagnosis not present

## 2022-09-20 DIAGNOSIS — Z1231 Encounter for screening mammogram for malignant neoplasm of breast: Secondary | ICD-10-CM

## 2022-09-20 DIAGNOSIS — R21 Rash and other nonspecific skin eruption: Secondary | ICD-10-CM | POA: Diagnosis not present

## 2022-09-20 MED ORDER — TRIAMCINOLONE ACETONIDE 0.1 % EX CREA: 1.0000 | TOPICAL_CREAM | Freq: Two times a day (BID) | CUTANEOUS | 2 refills | Status: DC

## 2022-09-20 NOTE — Assessment & Plan Note (Signed)
Chronic, stable with use of topical mid potency steroid creams; encouraged to call and schedule with derm

## 2022-09-20 NOTE — Assessment & Plan Note (Signed)
Needs to schedule dentist; no vision concerns s/p laser eye sx Will send in for mammogram PAP completed 2022 Due for colon cancer screening; wishes to do cologuard once she is 46  Things to do to keep yourself healthy  - Exercise at least 30-45 minutes a day, 3-4 days a week.  - Eat a low-fat diet with lots of fruits and vegetables, up to 7-9 servings per day.  - Seatbelts can save your life. Wear them always.  - Smoke detectors on every level of your home, check batteries every year.  - Eye Doctor - have an eye exam every 1-2 years  - Safe sex - if you may be exposed to STDs, use a condom.  - Alcohol -  If you drink, do it moderately, less than 2 drinks per day.  - [place]. Choose someone to speak for you if you are not able.  - Depression is common in our stressful world.If you're feeling down or losing interest in things you normally enjoy, please come in for a visit.  - Violence - If anyone is threatening or hurting you, please call immediately.

## 2022-09-20 NOTE — Assessment & Plan Note (Signed)
Chronic, has not been able to schedule with derm Request for medication refill; discussed risks associated with prolong use including but not limited to skin thinning and risk for sunburns

## 2022-09-20 NOTE — Assessment & Plan Note (Signed)
Due for screening for mammogram, denies breast concerns, provided with phone number to call and schedule appointment for mammogram. Encouraged to repeat breast cancer screening every 1-2 years.  

## 2022-09-20 NOTE — Patient Instructions (Signed)
Please call and schedule your mammogram:  [facility]  [address], [address] [facility] [place],  [place]  [address] Get Driving Directions Main: [phone]  Sunday:Closed Monday:7:20 AM - 5:00 PM Tuesday:7:20 AM - 5:00 PM Wednesday:7:20 AM - 5:00 PM Thursday:7:20 AM - 5:00 PM Friday:7:20 AM - 4:30 PM Saturday:Closed  

## 2022-09-20 NOTE — Assessment & Plan Note (Signed)
Screening for lipids; I recommend diet low in saturated fat and regular exercise - 30 min at least 5 times per week

## 2022-09-20 NOTE — Assessment & Plan Note (Signed)
Screening for DM; Continue to recommend balanced, lower carb meals. Smaller meal size, adding snacks. Choosing water as drink of choice and increasing purposeful exercise.

## 2022-09-21 LAB — CBC WITH DIFFERENTIAL/PLATELET
Basophils Absolute: 0 10*3/uL (ref 0.0–0.2)
Basos: 0 %
EOS (ABSOLUTE): 0 10*3/uL (ref 0.0–0.4)
Eos: 0 %
Hematocrit: 41.3 % (ref 34.0–46.6)
Hemoglobin: 14.2 g/dL (ref 11.1–15.9)
Immature Grans (Abs): 0 10*3/uL (ref 0.0–0.1)
Immature Granulocytes: 0 %
Lymphocytes Absolute: 0.8 10*3/uL (ref 0.7–3.1)
Lymphs: 16 %
MCH: 30.8 pg (ref 26.6–33.0)
MCHC: 34.4 g/dL (ref 31.5–35.7)
MCV: 90 fL (ref 79–97)
Monocytes Absolute: 0.5 10*3/uL (ref 0.1–0.9)
Monocytes: 10 %
Neutrophils Absolute: 3.5 10*3/uL (ref 1.4–7.0)
Neutrophils: 74 %
Platelets: 86 10*3/uL — CL (ref 150–450)
RBC: 4.61 x10E6/uL (ref 3.77–5.28)
RDW: 12.4 % (ref 11.7–15.4)
WBC: 4.8 10*3/uL (ref 3.4–10.8)

## 2022-09-21 LAB — COMPREHENSIVE METABOLIC PANEL
ALT: 19 IU/L (ref 0–32)
AST: 18 IU/L (ref 0–40)
Albumin/Globulin Ratio: 2.2 (ref 1.2–2.2)
Albumin: 4.7 g/dL (ref 3.9–4.9)
Alkaline Phosphatase: 59 IU/L (ref 44–121)
BUN/Creatinine Ratio: 20 (ref 9–23)
BUN: 18 mg/dL (ref 6–24)
Bilirubin Total: 0.6 mg/dL (ref 0.0–1.2)
CO2: 23 mmol/L (ref 20–29)
Calcium: 9.4 mg/dL (ref 8.7–10.2)
Chloride: 104 mmol/L (ref 96–106)
Creatinine, Ser: 0.88 mg/dL (ref 0.57–1.00)
Globulin, Total: 2.1 g/dL (ref 1.5–4.5)
Glucose: 78 mg/dL (ref 70–99)
Potassium: 4.2 mmol/L (ref 3.5–5.2)
Sodium: 140 mmol/L (ref 134–144)
Total Protein: 6.8 g/dL (ref 6.0–8.5)
eGFR: 83 mL/min/{1.73_m2} (ref >59)

## 2022-09-21 LAB — HEMOGLOBIN A1C
Est. average glucose Bld gHb Est-mCnc: 108 mg/dL
Hgb A1c MFr Bld: 5.4 % (ref 4.8–5.6)

## 2022-09-21 LAB — TSH+FREE T4
Free T4: 1.36 ng/dL (ref 0.82–1.77)
TSH: 1.79 u[IU]/mL (ref 0.450–4.500)

## 2022-09-21 LAB — LIPID PANEL
Chol/HDL Ratio: 4.9 ratio — ABNORMAL HIGH (ref 0.0–4.4)
Cholesterol, Total: 187 mg/dL (ref 100–199)
HDL: 38 mg/dL — ABNORMAL LOW (ref >39)
LDL Chol Calc (NIH): 137 mg/dL — ABNORMAL HIGH (ref 0–99)
Triglycerides: 64 mg/dL (ref 0–149)
VLDL Cholesterol Cal: 12 mg/dL (ref 5–40)

## 2022-09-22 NOTE — Progress Notes (Signed)
Platelets continue to remain low; be mindful of activities that could cause injury as you are at an increased risk for bleeding.  Cholesterol remains elevated. The 10-year ASCVD risk score (Arnett DK, et al., 2019) is: 1.1%   Values used to calculate the score:     Age: 46 years     Sex: Female     Is Non-Hispanic African American: No     Diabetic: No     Tobacco smoker: No     Systolic Blood Pressure: 993 mmHg     Is BP treated: No     HDL Cholesterol: 38 mg/dL     Total Cholesterol: 187 mg/dL Heart attack and stroke risk is 1% estimated within the next 10 years which is low. I recommend diet low in saturated fat and regular exercise - 30 min at least 5 times per week  All other labs are normal and stable; very reassuring.  Take care, Carolyn Haynes, [address] [place], [place] [address] [phone] (phone) [phone] (fax) [place]

## 2022-09-23 ENCOUNTER — Encounter: Payer: Self-pay | Admitting: Family Medicine

## 2022-10-08 DIAGNOSIS — M76892 Other specified enthesopathies of left lower limb, excluding foot: Secondary | ICD-10-CM | POA: Diagnosis not present

## 2022-10-08 DIAGNOSIS — M25552 Pain in left hip: Secondary | ICD-10-CM | POA: Diagnosis not present

## 2022-11-22 ENCOUNTER — Other Ambulatory Visit: Payer: Self-pay | Admitting: Family Medicine

## 2022-11-22 DIAGNOSIS — E039 Hypothyroidism, unspecified: Secondary | ICD-10-CM

## 2022-11-22 NOTE — Telephone Encounter (Signed)
Requested Prescriptions  Pending Prescriptions Disp Refills   levothyroxine (SYNTHROID) 50 MCG tablet [Pharmacy Med Name: LEVOTHYROXINE 50 MCG TABLET] 90 tablet 3    Sig: TAKE 1 TABLET BY MOUTH EVERY DAY     Endocrinology:  Hypothyroid Agents Passed - 11/22/2022  1:58 AM      Passed - TSH in normal range and within 360 days    TSH  Date Value Ref Range Status  09/20/2022 1.790 0.450 - 4.500 uIU/mL Final         Passed - Valid encounter within last 12 months    Recent Outpatient Visits           2 months ago Annual physical exam   [facility] Jacky Kindle, FNP   3 months ago Facial erythema   [facility] Jacky Kindle, FNP   1 year ago Post-COVID chronic cough   [facility] Jacky Kindle, FNP   1 year ago Post-viral cough syndrome   [facility], Marzella Schlein, MD   2 years ago Annual physical exam   [facility] Osvaldo Angst M, [place]

## 2022-12-18 DIAGNOSIS — L710 Perioral dermatitis: Secondary | ICD-10-CM | POA: Diagnosis not present

## 2023-01-24 DIAGNOSIS — E538 Deficiency of other specified B group vitamins: Secondary | ICD-10-CM | POA: Diagnosis not present

## 2023-01-24 DIAGNOSIS — Z79899 Other long term (current) drug therapy: Secondary | ICD-10-CM | POA: Diagnosis not present

## 2023-01-24 DIAGNOSIS — G500 Trigeminal neuralgia: Secondary | ICD-10-CM | POA: Diagnosis not present

## 2023-01-24 DIAGNOSIS — E559 Vitamin D deficiency, unspecified: Secondary | ICD-10-CM | POA: Diagnosis not present

## 2023-08-01 ENCOUNTER — Ambulatory Visit: Payer: BC Managed Care – PPO | Admitting: Obstetrics

## 2023-08-01 ENCOUNTER — Encounter: Payer: Self-pay | Admitting: Obstetrics

## 2023-08-01 VITALS — BP 122/82 | HR 81 | Ht 61.5 in | Wt 134.0 lb

## 2023-08-01 DIAGNOSIS — Z975 Presence of (intrauterine) contraceptive device: Secondary | ICD-10-CM | POA: Insufficient documentation

## 2023-08-01 DIAGNOSIS — Z30433 Encounter for removal and reinsertion of intrauterine contraceptive device: Secondary | ICD-10-CM

## 2023-08-01 MED ORDER — LEVONORGESTREL 19.5 MG IU IUD
INTRAUTERINE_SYSTEM | Freq: Once | INTRAUTERINE | Status: AC
Start: 2023-08-01 — End: 2023-08-01
  Administered 2023-08-01: 1 via INTRAUTERINE

## 2023-08-01 NOTE — Progress Notes (Signed)
Carolyn Haynes presents for removal of her Rutha Bouchard, and IUD insertion. She uses the IUD for cycle control. Her previous insertion was challenging a d took a long time per her report. She does not get periods most of the time with the IUD in place. Hx of SVD, daughter is a teen. O: BP 122/82   Pulse 81   Ht 5' 1.5" (1.562 m)   Wt 134 lb (60.8 kg)   BMI 24.91 kg/m   Review of Systems  Constitutional: Negative.   HENT: Negative.    Eyes: Negative.   Cardiovascular: Negative.   Gastrointestinal: Negative.   Skin: Negative.   All other systems reviewed and are negative.  Physical Exam Constitutional:      Appearance: Normal appearance. She is normal weight.  Cardiovascular:     Rate and Rhythm: Normal rate and regular rhythm.     Pulses: Normal pulses.     Heart sounds: Normal heart sounds.  Pulmonary:     Effort: Pulmonary effort is normal.     Breath sounds: Normal breath sounds.  Abdominal:     General: Abdomen is flat.     Palpations: Abdomen is soft.  Genitourinary:    General: Normal vulva.     Rectum: Normal.     Comments: Bimanual- uterus is non enlarged, midline, anteverted. Normal vaginal discharge. Skin:    General: Skin is warm and dry.  Neurological:     General: No focal deficit present.     Mental Status: She is alert and oriented to person, place, and time.  Psychiatric:        Mood and Affect: Mood normal.        Behavior: Behavior normal.    A: IUD removal and insertion of new IUD [name] used.     IUD Removal  Patient identified, informed consent performed, consent signed.  Patient was in the dorsal lithotomy position, normal external genitalia was noted.  A speculum was placed in the patient's vagina, normal discharge was noted, no lesions. The cervix was visualized, no lesions, no abnormal discharge.  The strings of the IUD were grasped and pulled using ring forceps. The IUD was removed in its entirety.  Patient tolerated the procedure well.    Patient will  use another Rutha Bouchard , placed today. for   Routine preventative health maintenance measures emphasized.   IUD Insertion Procedure Note Patient identified, informed consent performed, consent signed.   Discussed risks of irregular bleeding, cramping, infection, malpositioning, expulsion or uterine perforation of the IUD (1:1000 placements)  which may require further procedure such as laparoscopy.  IUD while effective at preventing pregnancy do not prevent transmission of sexually transmitted diseases and use of barrier methods for this purpose was discussed. Time out was performed.  Urine pregnancy test negative.  Speculum placed in the vagina.  Cervix visualized.  Cleaned with Betadine x 2.  Grasped anteriorly with a single tooth tenaculum.  Uterus sounded to 6.5 cm. IUD placed per manufacturer's recommendations.  Strings trimmed to 3 cm. Tenaculum was removed, good hemostasis noted.  Patient tolerated procedure well.   Patient was given post-procedure instructions.  She was advised to have backup contraception for one week.  Patient was also asked to check IUD strings periodically and follow up in 4 weeks for IUD check.   Mirna Mires, CNM  08/01/2023 3:15 PM

## 2023-08-21 ENCOUNTER — Other Ambulatory Visit: Payer: BC Managed Care – PPO

## 2023-08-21 ENCOUNTER — Other Ambulatory Visit: Payer: Self-pay | Admitting: Student

## 2023-08-21 DIAGNOSIS — G510 Bell's palsy: Secondary | ICD-10-CM | POA: Diagnosis not present

## 2023-08-21 DIAGNOSIS — G500 Trigeminal neuralgia: Secondary | ICD-10-CM | POA: Diagnosis not present

## 2023-08-22 ENCOUNTER — Ambulatory Visit
Admission: RE | Admit: 2023-08-22 | Discharge: 2023-08-22 | Disposition: A | Discharge status: Home / Self Care | Payer: BC Managed Care – PPO | Source: Ambulatory Visit | Attending: Student | Admitting: Student

## 2023-08-22 DIAGNOSIS — G510 Bell's palsy: Secondary | ICD-10-CM

## 2023-08-22 MED ORDER — GADOPICLENOL 0.5 MMOL/ML IV SOLN
7.5000 mL | Freq: Once | INTRAVENOUS | Status: AC | PRN
  Administered 2023-08-22: 6 mL via INTRAVENOUS

## 2023-08-25 DIAGNOSIS — J22 Unspecified acute lower respiratory infection: Secondary | ICD-10-CM | POA: Diagnosis not present

## 2023-09-01 NOTE — Progress Notes (Unsigned)
GYNECOLOGY PROGRESS NOTE  Subjective:    Patient ID: Carolyn Haynes, female    DOB: [birth year], 47 y.o.   MRN: [id number]  HPI  Patient is a 47 y.o. [id number] female who presents for IUD check, reports no issues or concerns. She is on her period. Denies any pelvic pain or ongloing cramping.  The following portions of the patient's history were reviewed and updated as appropriate: allergies, current medications, past family history, past medical history, past social history, past surgical history, and problem list.  Review of Systems Pertinent items are noted in HPI.   Objective:   There were no vitals taken for this visit. There is no height or weight on file to calculate BMI. General appearance: alert, cooperative, and no distress Abdomen: soft, non-tender; bowel sounds normal; no masses,  no organomegaly Pelvic: cervix normal in appearance, external genitalia normal, no adnexal masses or tenderness, no cervical motion tenderness, rectovaginal septum normal, uterus normal size, shape, and consistency, and vagina normal without discharge IUD strings visualized. Extremities: extremities normal, atraumatic, no cyanosis or edema Neurologic: Grossly normal   Assessment:   1. IUD check up    No problems with her IUD- doing very well.  Plan:   She knkows to call or contact us should she have any questions or concerns about the IUD. RTC for her next annual.  Mirna Mires, CNM  09/02/2023 2:52 PM

## 2023-09-02 ENCOUNTER — Encounter: Payer: Self-pay | Admitting: Obstetrics

## 2023-09-02 ENCOUNTER — Ambulatory Visit: Payer: BC Managed Care – PPO | Admitting: Obstetrics

## 2023-09-02 VITALS — BP 118/83 | HR 78 | Ht 61.5 in | Wt 131.0 lb

## 2023-09-02 DIAGNOSIS — Z30431 Encounter for routine checking of intrauterine contraceptive device: Secondary | ICD-10-CM

## 2023-09-15 ENCOUNTER — Encounter: Payer: Self-pay | Admitting: Obstetrics

## 2023-09-15 ENCOUNTER — Other Ambulatory Visit: Payer: Self-pay | Admitting: Obstetrics

## 2023-09-15 DIAGNOSIS — N921 Excessive and frequent menstruation with irregular cycle: Secondary | ICD-10-CM

## 2023-09-15 MED ORDER — LO LOESTRIN FE 1 MG-10 MCG / 10 MCG PO TABS
1.0000 | ORAL_TABLET | Freq: Every day | ORAL | 0 refills | Status: DC
Start: 2023-09-15 — End: 2023-12-31

## 2023-09-16 DIAGNOSIS — N3001 Acute cystitis with hematuria: Secondary | ICD-10-CM | POA: Diagnosis not present

## 2023-09-24 DIAGNOSIS — M79671 Pain in right foot: Secondary | ICD-10-CM | POA: Diagnosis not present

## 2023-09-24 DIAGNOSIS — M7989 Other specified soft tissue disorders: Secondary | ICD-10-CM | POA: Diagnosis not present

## 2023-11-05 ENCOUNTER — Other Ambulatory Visit: Payer: Self-pay | Admitting: Family Medicine

## 2023-11-05 DIAGNOSIS — E039 Hypothyroidism, unspecified: Secondary | ICD-10-CM

## 2023-12-01 ENCOUNTER — Encounter: Payer: Self-pay | Admitting: Family Medicine

## 2023-12-16 ENCOUNTER — Other Ambulatory Visit: Payer: Self-pay | Admitting: Family Medicine

## 2023-12-16 DIAGNOSIS — E039 Hypothyroidism, unspecified: Secondary | ICD-10-CM

## 2023-12-31 ENCOUNTER — Ambulatory Visit (INDEPENDENT_AMBULATORY_CARE_PROVIDER_SITE_OTHER): Payer: BC Managed Care – PPO | Admitting: Family Medicine

## 2023-12-31 ENCOUNTER — Encounter: Payer: Self-pay | Admitting: Family Medicine

## 2023-12-31 VITALS — BP 137/97 | HR 72 | Ht 61.5 in | Wt 133.0 lb

## 2023-12-31 DIAGNOSIS — E039 Hypothyroidism, unspecified: Secondary | ICD-10-CM

## 2023-12-31 DIAGNOSIS — Z130 Encounter for screening for diseases of the blood and blood-forming organs and certain disorders involving the immune mechanism: Secondary | ICD-10-CM | POA: Diagnosis not present

## 2023-12-31 DIAGNOSIS — R030 Elevated blood-pressure reading, without diagnosis of hypertension: Secondary | ICD-10-CM | POA: Insufficient documentation

## 2023-12-31 DIAGNOSIS — E538 Deficiency of other specified B group vitamins: Secondary | ICD-10-CM

## 2023-12-31 DIAGNOSIS — Z0000 Encounter for general adult medical examination without abnormal findings: Secondary | ICD-10-CM

## 2023-12-31 DIAGNOSIS — E559 Vitamin D deficiency, unspecified: Secondary | ICD-10-CM

## 2023-12-31 MED ORDER — LEVOTHYROXINE SODIUM 50 MCG PO TABS: 50.0000 ug | ORAL_TABLET | Freq: Every day | ORAL | 3 refills | Status: AC

## 2023-12-31 NOTE — Assessment & Plan Note (Signed)
 Stable, chronic Synthroid  50mcg daily TSH check today - pt held biotin for one week Will adjust med if needed based on lab result

## 2023-12-31 NOTE — Assessment & Plan Note (Signed)
 Annual physical Exam - Pt moving to [place]  in 3 weeks -Routine Labs - will communicate results. -Recommended tetanus booster, can be obtained at community health clinics, public health clinics, or primary care offices. -Recommended colon cancer screening with Cologuard after patient establishes new care following move. -Recommended annual mammograms after age of 58, to be initiated after patient establishes new care following move. -Continue with IUD for contraception and potential menopausal symptom management. - Sunscreen for skin protection - Safety - wear seat belt and helmet with biking

## 2023-12-31 NOTE — Progress Notes (Signed)
 Complete physical exam  Patient: Carolyn Haynes   DOB: 03-Aug-1976   48 y.o. Female  MRN: [id number]  Subjective:    Chief Complaint  Patient presents with   Annual Exam    Carolyn Haynes is a 48 y.o. female who presents today for a complete physical exam. She reports consuming a general diet. Home exercise routine includes pilates three times per week. She generally feels well. She reports sleeping well. She does not have additional problems to discuss today.   The patient's diet consists mainly of chicken and greens, with occasional indulgence in less healthy foods. She reports an intolerance to pre-breaded and fried foods, but can tolerate foods she breads and fries herself. States has been tested, does not have celiac disease. She does not follow a specific diet.  Sleep is reported to be good, with the patient typically getting seven hours of sleep per night. She falls asleep easily and does not report any sleep disturbances.  The patient has a history of trigeminal neuralgia for which she takes oxcarbazepine. She also takes biotin, vitamin B12, vitamin D3, magnesium, and folic acid for this disorder per neurologist, Dr. Mason Sole.  The patient reports a past episode of Bell's palsy in fall 2024, which was treated with steroids.   Birth Control  - She also has an IUD in place and does not experience menstrual periods.  The patient is planning to move in three weeks to [place]  and is trying to get all health checks done before finding a new provider. She has not had a mammogram yet and is due for one. She also needs to have a colonoscopy, which she plans to do after moving.  The patient's blood pressure was slightly elevated during the visit, but she does not have a diagnosis of hypertension and does not take any medication for blood pressure.  She also needs to re-establish dental care after moving, as she was not satisfied with her previous dentist.  The patient's physical examination  was generally unremarkable, with no significant findings.  Feels safe at home.  Most recent fall risk assessment:    12/31/2023    3:48 PM  Fall Risk   Falls in the past year? 0  Number falls in past yr: 0  Injury with Fall? 0     Most recent depression screenings:    12/31/2023    3:48 PM 09/20/2022    3:11 PM  PHQ 2/9 Scores  PHQ - 2 Score 0 0  PHQ- 9 Score 0 0    Patient Active Problem List   Diagnosis Date Noted   Elevated blood pressure reading in office without diagnosis of hypertension 12/31/2023   IUD (intrauterine device) in place 08/01/2023   Annual physical exam 09/20/2022   Encounter for screening mammogram for malignant neoplasm of breast 09/20/2022   Screening for lipoid disorders 09/20/2022   Screening for diabetes mellitus 09/20/2022   Facial erythema 08/07/2022   Rash and nonspecific skin eruption 08/07/2022   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   Allergic rhinitis 08/02/2015   Adult hypothyroidism 08/02/2015   Bilateral polycystic ovarian syndrome 08/02/2015   Past Medical History:  Diagnosis Date   Allergic rhinitis    Asthma    Esophagitis, reflux    Hypothyroid    IBS (irritable bowel syndrome)    Polycystic ovaries    Past Surgical History:  Procedure Laterality Date   BRAIN SURGERY Right 10/13/2017   trigenial nerve    DILATION AND CURETTAGE OF UTERUS  2000  NASAL SINUS SURGERY  08/2013   Social History   Tobacco Use   Smoking status: Never   Smokeless tobacco: Never  Vaping Use   Vaping status: Never Used  Substance Use Topics   Alcohol use: No    Alcohol/week: 0.0 standard drinks of alcohol   Drug use: Never   Allergies  Allergen Reactions   Carbamazepine      Other reaction(s): Other (See Comments) Made patient sleepy loopy   Penicillins      Patient Care Team: Tasia Farr, FNP as PCP - General (Family Medicine)   Outpatient Medications Prior to Visit  Medication Sig   albuterol  (VENTOLIN  HFA) 108 (90 Base)  MCG/ACT inhaler TAKE 2 PUFFS BY MOUTH EVERY 6 HOURS AS NEEDED FOR WHEEZE OR SHORTNESS OF BREATH   BIOTIN PO Take 1 tablet by mouth daily.   cholecalciferol (VITAMIN D ) 1000 units tablet Take 1,000 Units by mouth daily.   FOLIC ACID PO Take 1 tablet by mouth daily.   levonorgestrel  ([name] ) 19.5 MG IUD 1 each by Intrauterine route once.   MAGNESIUM PO Take by mouth.   OXcarbazepine (TRILEPTAL) 150 MG tablet Take by mouth.   vitamin B-12 (CYANOCOBALAMIN ) 1000 MCG tablet Take by mouth.   [DISCONTINUED] levothyroxine  (SYNTHROID ) 50 MCG tablet TAKE 1 TABLET BY MOUTH EVERY DAY   [DISCONTINUED] Norethindrone-Ethinyl Estradiol -Fe Biphas (LO LOESTRIN FE ) 1 MG-10 MCG / 10 MCG tablet Take 1 tablet by mouth daily.   No facility-administered medications prior to visit.    Review of Systems  All other systems reviewed and are negative.      Objective:     BP (!) 137/97 (BP Location: Left Arm, Patient Position: Sitting, Cuff Size: Normal)   Pulse 72   Ht 5' 1.5" (1.562 m)   Wt 133 lb (60.3 kg)   SpO2 100%   BMI 24.72 kg/m  BP Readings from Last 3 Encounters:  12/31/23 (!) 137/97  09/02/23 118/83  08/01/23 122/82   Wt Readings from Last 3 Encounters:  12/31/23 133 lb (60.3 kg)  09/02/23 131 lb (59.4 kg)  08/01/23 134 lb (60.8 kg)     Physical Exam Constitutional:      General: She is not in acute distress.    Appearance: Normal appearance. She is normal weight. She is not ill-appearing, toxic-appearing or diaphoretic.  HENT:     Head: Normocephalic.     Right Ear: Tympanic membrane normal.     Left Ear: Tympanic membrane normal.     Nose: Nose normal.     Mouth/Throat:     Mouth: Mucous membranes are moist.     Pharynx: Oropharynx is clear. No oropharyngeal exudate or posterior oropharyngeal erythema.  Eyes:     General: Lids are normal.     Extraocular Movements: Extraocular movements intact.     Right eye: Normal extraocular motion.     Left eye: Normal extraocular motion.      Conjunctiva/sclera: Conjunctivae normal.     Right eye: Right conjunctiva is not injected.     Left eye: Left conjunctiva is not injected.     Pupils: Pupils are equal, round, and reactive to light.  Neck:     Thyroid: No thyroid mass, thyromegaly or thyroid tenderness.  Cardiovascular:     Rate and Rhythm: Normal rate.     Pulses: Normal pulses.          Radial pulses are 2+ on the right side and 2+ on the left side.  Dorsalis pedis pulses are 2+ on the right side and 2+ on the left side.       Posterior tibial pulses are 2+ on the right side and 2+ on the left side.     Heart sounds: Normal heart sounds, S1 normal and S2 normal. No murmur heard.    No friction rub. No gallop.  Pulmonary:     Effort: Pulmonary effort is normal. No respiratory distress.     Breath sounds: Normal breath sounds. No stridor. No wheezing, rhonchi or rales.  Chest:     Comments: Denies need for breast exam or concerns today. Abdominal:     General: Bowel sounds are normal. There is no distension.     Palpations: Abdomen is soft. There is no mass.     Tenderness: There is no abdominal tenderness. There is no guarding or rebound.     Hernia: No hernia is present.  Genitourinary:    Comments: States UTD with pap through GYN provider - no GU concerns today. Musculoskeletal:        General: No swelling or tenderness. Normal range of motion.     Cervical back: Normal range of motion. No rigidity.  Lymphadenopathy:     Cervical: No cervical adenopathy.     Right cervical: No superficial, deep or posterior cervical adenopathy.    Left cervical: No superficial, deep or posterior cervical adenopathy.  Skin:    General: Skin is warm and dry.     Capillary Refill: Capillary refill takes less than 2 seconds.     Findings: No bruising or erythema.  Neurological:     General: No focal deficit present.     Mental Status: She is alert and oriented to person, place, and time. Mental status is at baseline.      GCS: GCS eye subscore is 4. GCS verbal subscore is 5. GCS motor subscore is 6.     Cranial Nerves: No cranial nerve deficit.     Sensory: No sensory deficit.     Motor: No weakness, tremor or pronator drift.     Coordination: Romberg sign negative.     Gait: Gait is intact. Gait normal.  Psychiatric:        Attention and Perception: Attention and perception normal.        Mood and Affect: Mood and affect normal.        Speech: Speech normal.        Behavior: Behavior normal. Behavior is cooperative.        Thought Content: Thought content normal.        Cognition and Memory: Cognition and memory normal.        Judgment: Judgment normal.      No results found for any visits on 12/31/23.     Assessment & Plan:    Routine Health Maintenance and Physical Exam  Health Maintenance  Topic Date Due   COVID-19 Vaccine (1) Never done   DTaP/Tdap/Td vaccine (1 - Tdap) Never done   Colon Cancer Screening  Never done   Flu Shot  03/15/2024*   Pap with HPV screening  04/12/2026   Hepatitis C Screening  Completed   HIV Screening  Completed   HPV Vaccine  Aged Out  *Topic was postponed. The date shown is not the original due date.    Discussed health benefits of physical activity, and encouraged her to engage in regular exercise appropriate for her age and condition.  Annual physical exam Assessment & Plan: Annual physical  Exam - Pt moving to [place]  in 3 weeks -Routine Labs - will communicate results. -Recommended tetanus booster, can be obtained at community health clinics, public health clinics, or primary care offices. -Recommended colon cancer screening with Cologuard after patient establishes new care following move. -Recommended annual mammograms after age of 46, to be initiated after patient establishes new care following move. -Continue with IUD for contraception and potential menopausal symptom management. - Sunscreen for skin protection - Safety - wear seat belt and  helmet with biking   Adult hypothyroidism Assessment & Plan: Stable, chronic Synthroid  50mcg daily TSH check today - pt held biotin for one week Will adjust med if needed based on lab result  Orders: -     TSH -     Levothyroxine  Sodium; Take 1 tablet (50 mcg total) by mouth daily.  Dispense: 90 tablet; Refill: 3  Elevated blood pressure reading in office without diagnosis of hypertension Assessment & Plan: Elevated BP during visit today = 151/87, recheck =137/97 No hx of HTN - Recommend low sodium diet, exercise - Monitor at home with upper arm automatic cuff  Goal<120/80 - F/u on this issue with new PCP in [place]  Orders: -     Comprehensive metabolic panel -     Lipid panel  Vitamin B12 deficiency -     Vitamin B12  Vitamin D  deficiency -     VITAMIN D  25 Hydroxy (Vit-D Deficiency, Fractures)  Screening for deficiency anemia -     CBC with Differential/Platelet    Will communicate lab results.  Return as needed for chronic disease mgmt. Pt to establish care with new PCP in [place] once settled into new home.    I, Tasia Farr, FNP, have reviewed all documentation for this visit. The documentation on 12/31/23 for the exam, diagnosis, procedures, and orders are all accurate and complete.   Tasia Farr, FNP

## 2023-12-31 NOTE — Assessment & Plan Note (Signed)
 Elevated BP during visit today = 151/87, recheck =137/97 No hx of HTN - Recommend low sodium diet, exercise - Monitor at home with upper arm automatic cuff  Goal<120/80 - F/u on this issue with new PCP in [place]

## 2024-01-01 LAB — CBC WITH DIFFERENTIAL/PLATELET
Basophils Absolute: 0 10*3/uL (ref 0.0–0.2)
Basos: 0 %
EOS (ABSOLUTE): 0 10*3/uL (ref 0.0–0.4)
Eos: 0 %
Hematocrit: 42.3 % (ref 34.0–46.6)
Hemoglobin: 14 g/dL (ref 11.1–15.9)
Immature Grans (Abs): 0 10*3/uL (ref 0.0–0.1)
Immature Granulocytes: 0 %
Lymphocytes Absolute: 1.7 10*3/uL (ref 0.7–3.1)
Lymphs: 32 %
MCH: 30.4 pg (ref 26.6–33.0)
MCHC: 33.1 g/dL (ref 31.5–35.7)
MCV: 92 fL (ref 79–97)
Monocytes Absolute: 0.4 10*3/uL (ref 0.1–0.9)
Monocytes: 8 %
Neutrophils Absolute: 3.1 10*3/uL (ref 1.4–7.0)
Neutrophils: 60 %
Platelets: 128 10*3/uL — ABNORMAL LOW (ref 150–450)
RBC: 4.6 x10E6/uL (ref 3.77–5.28)
RDW: 12.7 % (ref 11.7–15.4)
WBC: 5.2 10*3/uL (ref 3.4–10.8)

## 2024-01-01 LAB — COMPREHENSIVE METABOLIC PANEL
ALT: 21 [IU]/L (ref 0–32)
AST: 19 [IU]/L (ref 0–40)
Albumin: 4.7 g/dL (ref 3.9–4.9)
Alkaline Phosphatase: 68 [IU]/L (ref 44–121)
BUN/Creatinine Ratio: 21 (ref 9–23)
BUN: 17 mg/dL (ref 6–24)
Bilirubin Total: 0.4 mg/dL (ref 0.0–1.2)
CO2: 20 mmol/L (ref 20–29)
Calcium: 9.2 mg/dL (ref 8.7–10.2)
Chloride: 91 mmol/L — ABNORMAL LOW (ref 96–106)
Creatinine, Ser: 0.8 mg/dL (ref 0.57–1.00)
Globulin, Total: 1.7 g/dL (ref 1.5–4.5)
Glucose: 76 mg/dL (ref 70–99)
Potassium: 5 mmol/L (ref 3.5–5.2)
Sodium: 141 mmol/L (ref 134–144)
Total Protein: 6.4 g/dL (ref 6.0–8.5)
eGFR: 92 mL/min/{1.73_m2} (ref >59)

## 2024-01-01 LAB — VITAMIN B12: Vitamin B-12: >2000 pg/mL — ABNORMAL HIGH (ref 232–1245)

## 2024-01-01 LAB — LIPID PANEL
Chol/HDL Ratio: 5 {ratio} — ABNORMAL HIGH (ref 0.0–4.4)
Cholesterol, Total: 208 mg/dL — ABNORMAL HIGH (ref 100–199)
HDL: 42 mg/dL (ref >39)
LDL Chol Calc (NIH): 150 mg/dL — ABNORMAL HIGH (ref 0–99)
Triglycerides: 86 mg/dL (ref 0–149)
VLDL Cholesterol Cal: 16 mg/dL (ref 5–40)

## 2024-01-01 LAB — VITAMIN D 25 HYDROXY (VIT D DEFICIENCY, FRACTURES): Vit D, 25-Hydroxy: 92.4 ng/mL (ref 30.0–100.0)

## 2024-01-01 LAB — TSH: TSH: 2.88 u[IU]/mL (ref 0.450–4.500)

## 2024-10-07 ENCOUNTER — Other Ambulatory Visit: Payer: Self-pay

## 2024-10-07 ENCOUNTER — Telehealth: Payer: Self-pay | Admitting: Family Medicine

## 2024-10-07 DIAGNOSIS — E039 Hypothyroidism, unspecified: Secondary | ICD-10-CM

## 2024-10-07 NOTE — Telephone Encounter (Signed)
 Opened to send prescription in. Patient no longer with [facility].

## 2024-10-07 NOTE — Telephone Encounter (Signed)
 Patient reports that She is no longer at [facility].she lives in [place]  and [facility] not [facility]. No need of prescription refills.

## 2024-10-07 NOTE — Telephone Encounter (Signed)
[facility] faxed refill request for the following medications:   levothyroxine (SYNTHROID) 50 MCG tablet   Please advise.
